# Patient Record
Sex: Female | Born: 1945 | Race: Black or African American | Hispanic: No | Smoking: Never smoker
Health system: Southern US, Community
[De-identification: ages and names within clinical notes are randomized; demographics above are authoritative.]

## PROBLEM LIST (undated history)

## (undated) DIAGNOSIS — I10 Essential (primary) hypertension: Secondary | ICD-10-CM

## (undated) DIAGNOSIS — F329 Major depressive disorder, single episode, unspecified: Secondary | ICD-10-CM

## (undated) DIAGNOSIS — I639 Cerebral infarction, unspecified: Secondary | ICD-10-CM

## (undated) DIAGNOSIS — E785 Hyperlipidemia, unspecified: Secondary | ICD-10-CM

## (undated) DIAGNOSIS — T7840XA Allergy, unspecified, initial encounter: Secondary | ICD-10-CM

## (undated) DIAGNOSIS — F32A Depression, unspecified: Secondary | ICD-10-CM

## (undated) HISTORY — DX: Essential (primary) hypertension: I10

## (undated) HISTORY — PX: CATARACT EXTRACTION: SUR2

## (undated) HISTORY — DX: Cerebral infarction, unspecified: I63.9

## (undated) HISTORY — DX: Allergy, unspecified, initial encounter: T78.40XA

## (undated) HISTORY — DX: Hyperlipidemia, unspecified: E78.5

## (undated) HISTORY — PX: TUBAL LIGATION: SHX77

---

## 2000-11-26 ENCOUNTER — Inpatient Hospital Stay (HOSPITAL_COMMUNITY): Admission: EM | Admit: 2000-11-26 | Discharge: 2000-11-29 | Payer: Self-pay | Admitting: Internal Medicine

## 2000-11-26 ENCOUNTER — Encounter: Payer: Self-pay | Admitting: Orthopaedic Surgery

## 2000-11-26 ENCOUNTER — Encounter: Payer: Self-pay | Admitting: Internal Medicine

## 2000-11-27 ENCOUNTER — Encounter: Payer: Self-pay | Admitting: Orthopaedic Surgery

## 2000-12-08 ENCOUNTER — Encounter: Payer: Self-pay | Admitting: Family Medicine

## 2000-12-08 ENCOUNTER — Encounter: Payer: Self-pay | Admitting: *Deleted

## 2000-12-08 ENCOUNTER — Inpatient Hospital Stay (HOSPITAL_COMMUNITY): Admission: EM | Admit: 2000-12-08 | Discharge: 2000-12-11 | Payer: Self-pay | Admitting: *Deleted

## 2001-05-25 ENCOUNTER — Encounter: Payer: Self-pay | Admitting: Family Medicine

## 2001-05-25 ENCOUNTER — Ambulatory Visit (HOSPITAL_COMMUNITY): Admission: RE | Admit: 2001-05-25 | Discharge: 2001-05-25 | Payer: Self-pay | Admitting: Family Medicine

## 2001-05-27 ENCOUNTER — Encounter: Payer: Self-pay | Admitting: Obstetrics and Gynecology

## 2001-05-27 ENCOUNTER — Ambulatory Visit (HOSPITAL_COMMUNITY): Admission: RE | Admit: 2001-05-27 | Discharge: 2001-05-27 | Payer: Self-pay | Admitting: Obstetrics and Gynecology

## 2001-06-29 ENCOUNTER — Other Ambulatory Visit: Admission: RE | Admit: 2001-06-29 | Discharge: 2001-06-29 | Payer: Self-pay | Admitting: Family Medicine

## 2002-06-02 ENCOUNTER — Ambulatory Visit (HOSPITAL_COMMUNITY): Admission: RE | Admit: 2002-06-02 | Discharge: 2002-06-02 | Payer: Self-pay | Admitting: Family Medicine

## 2002-06-02 ENCOUNTER — Encounter: Payer: Self-pay | Admitting: Family Medicine

## 2002-06-06 HISTORY — PX: COLONOSCOPY: SHX174

## 2002-06-09 ENCOUNTER — Ambulatory Visit (HOSPITAL_COMMUNITY): Admission: RE | Admit: 2002-06-09 | Discharge: 2002-06-09 | Payer: Self-pay | Admitting: General Surgery

## 2002-06-25 ENCOUNTER — Ambulatory Visit (HOSPITAL_COMMUNITY): Admission: RE | Admit: 2002-06-25 | Discharge: 2002-06-25 | Payer: Self-pay | Admitting: Cardiology

## 2002-07-02 ENCOUNTER — Encounter: Payer: Self-pay | Admitting: Cardiology

## 2002-07-02 ENCOUNTER — Encounter (HOSPITAL_COMMUNITY): Admission: RE | Admit: 2002-07-02 | Discharge: 2002-08-01 | Payer: Self-pay | Admitting: Cardiology

## 2003-06-13 ENCOUNTER — Ambulatory Visit (HOSPITAL_COMMUNITY): Admission: RE | Admit: 2003-06-13 | Discharge: 2003-06-13 | Payer: Self-pay | Admitting: Family Medicine

## 2004-02-23 ENCOUNTER — Ambulatory Visit (HOSPITAL_COMMUNITY): Admission: RE | Admit: 2004-02-23 | Discharge: 2004-02-23 | Payer: Self-pay | Admitting: Family Medicine

## 2004-02-23 ENCOUNTER — Ambulatory Visit (HOSPITAL_COMMUNITY): Admission: RE | Admit: 2004-02-23 | Discharge: 2004-02-23 | Payer: Self-pay | Admitting: *Deleted

## 2004-04-26 ENCOUNTER — Ambulatory Visit (HOSPITAL_COMMUNITY): Admission: RE | Admit: 2004-04-26 | Discharge: 2004-04-26 | Payer: Self-pay | Admitting: Family Medicine

## 2004-06-18 ENCOUNTER — Ambulatory Visit: Payer: Self-pay | Admitting: Family Medicine

## 2004-06-20 ENCOUNTER — Ambulatory Visit (HOSPITAL_COMMUNITY): Admission: RE | Admit: 2004-06-20 | Discharge: 2004-06-20 | Payer: Self-pay | Admitting: Family Medicine

## 2004-08-23 ENCOUNTER — Ambulatory Visit: Payer: Self-pay | Admitting: Family Medicine

## 2004-09-14 ENCOUNTER — Ambulatory Visit: Payer: Self-pay | Admitting: Family Medicine

## 2004-10-18 ENCOUNTER — Ambulatory Visit: Payer: Self-pay | Admitting: Family Medicine

## 2004-12-21 ENCOUNTER — Ambulatory Visit: Payer: Self-pay | Admitting: Family Medicine

## 2005-04-10 ENCOUNTER — Ambulatory Visit: Payer: Self-pay | Admitting: Family Medicine

## 2005-07-22 ENCOUNTER — Ambulatory Visit (HOSPITAL_COMMUNITY): Admission: RE | Admit: 2005-07-22 | Discharge: 2005-07-22 | Payer: Self-pay | Admitting: Family Medicine

## 2005-08-27 ENCOUNTER — Ambulatory Visit: Payer: Self-pay | Admitting: Family Medicine

## 2005-12-31 ENCOUNTER — Ambulatory Visit: Payer: Self-pay | Admitting: Family Medicine

## 2006-01-08 ENCOUNTER — Observation Stay (HOSPITAL_COMMUNITY): Admission: AD | Admit: 2006-01-08 | Discharge: 2006-01-10 | Payer: Self-pay | Admitting: Family Medicine

## 2006-01-21 ENCOUNTER — Ambulatory Visit: Payer: Self-pay | Admitting: Family Medicine

## 2006-03-04 ENCOUNTER — Ambulatory Visit: Payer: Self-pay | Admitting: Family Medicine

## 2006-06-12 ENCOUNTER — Ambulatory Visit: Payer: Self-pay | Admitting: Family Medicine

## 2006-07-10 ENCOUNTER — Ambulatory Visit: Payer: Self-pay | Admitting: Family Medicine

## 2006-08-27 ENCOUNTER — Other Ambulatory Visit: Admission: RE | Admit: 2006-08-27 | Discharge: 2006-08-27 | Payer: Self-pay | Admitting: Family Medicine

## 2006-08-27 ENCOUNTER — Ambulatory Visit: Payer: Self-pay | Admitting: Family Medicine

## 2006-08-27 LAB — CONVERTED CEMR LAB: Pap Smear: NORMAL

## 2006-09-04 ENCOUNTER — Ambulatory Visit (HOSPITAL_COMMUNITY): Admission: RE | Admit: 2006-09-04 | Discharge: 2006-09-04 | Payer: Self-pay | Admitting: Family Medicine

## 2006-12-01 ENCOUNTER — Ambulatory Visit: Payer: Self-pay | Admitting: Family Medicine

## 2006-12-01 LAB — CONVERTED CEMR LAB
Albumin: 4.4 g/dL (ref 3.5–5.2)
Alkaline Phosphatase: 54 units/L (ref 39–117)
Indirect Bilirubin: 0.5 mg/dL (ref 0.0–0.9)
LDL Cholesterol: 124 mg/dL — ABNORMAL HIGH (ref 0–99)
Total Bilirubin: 0.6 mg/dL (ref 0.3–1.2)
Total CHOL/HDL Ratio: 3.8

## 2007-01-26 ENCOUNTER — Ambulatory Visit (HOSPITAL_COMMUNITY): Admission: RE | Admit: 2007-01-26 | Discharge: 2007-01-26 | Payer: Self-pay | Admitting: Family Medicine

## 2007-01-26 ENCOUNTER — Ambulatory Visit: Payer: Self-pay | Admitting: Family Medicine

## 2007-01-26 LAB — CONVERTED CEMR LAB
ALT: 30 units/L (ref 0–35)
Albumin: 4.8 g/dL (ref 3.5–5.2)
Alkaline Phosphatase: 61 units/L (ref 39–117)
BUN: 29 mg/dL — ABNORMAL HIGH (ref 6–23)
Creatinine, Ser: 1.52 mg/dL — ABNORMAL HIGH (ref 0.40–1.20)
Hemoglobin: 10.2 g/dL — ABNORMAL LOW (ref 12.0–15.0)
Lymphocytes Relative: 27 % (ref 12–46)
MCHC: 31.5 g/dL (ref 30.0–36.0)
MCV: 91 fL (ref 78.0–100.0)
Monocytes Relative: 11 % (ref 3–11)
Neutrophils Relative %: 60 % (ref 43–77)
Platelets: 247 10*3/uL (ref 150–400)
Potassium: 4.5 meq/L (ref 3.5–5.3)
RBC: 3.56 M/uL — ABNORMAL LOW (ref 3.87–5.11)
RDW: 16.8 % — ABNORMAL HIGH (ref 11.5–14.0)
Total Bilirubin: 0.7 mg/dL (ref 0.3–1.2)
Total Protein: 8.4 g/dL — ABNORMAL HIGH (ref 6.0–8.3)
WBC: 6.2 10*3/uL (ref 4.0–10.5)

## 2007-01-27 ENCOUNTER — Encounter: Payer: Self-pay | Admitting: Family Medicine

## 2007-01-27 LAB — CONVERTED CEMR LAB
Ferritin: 1066 ng/mL — ABNORMAL HIGH (ref 10–291)
Folate: 6.6 ng/mL
Iron: 40 ug/dL — ABNORMAL LOW (ref 42–145)
Retic Ct Pct: 1.5 % (ref 0.4–3.1)
TIBC: 308 ug/dL (ref 250–470)
UIBC: 268 ug/dL
Vitamin B-12: 460 pg/mL (ref 211–911)

## 2007-02-17 ENCOUNTER — Ambulatory Visit: Payer: Self-pay | Admitting: Gastroenterology

## 2007-02-19 ENCOUNTER — Ambulatory Visit: Payer: Self-pay | Admitting: Gastroenterology

## 2007-02-19 ENCOUNTER — Ambulatory Visit (HOSPITAL_COMMUNITY): Admission: RE | Admit: 2007-02-19 | Discharge: 2007-02-19 | Payer: Self-pay | Admitting: Gastroenterology

## 2007-02-19 HISTORY — PX: COLONOSCOPY: SHX174

## 2007-02-19 HISTORY — PX: ESOPHAGOGASTRODUODENOSCOPY: SHX1529

## 2007-03-09 ENCOUNTER — Ambulatory Visit: Payer: Self-pay | Admitting: Family Medicine

## 2007-04-17 ENCOUNTER — Ambulatory Visit: Payer: Self-pay | Admitting: Gastroenterology

## 2007-04-17 ENCOUNTER — Emergency Department (HOSPITAL_COMMUNITY): Admission: EM | Admit: 2007-04-17 | Discharge: 2007-04-17 | Payer: Self-pay | Admitting: Emergency Medicine

## 2007-05-01 ENCOUNTER — Emergency Department (HOSPITAL_COMMUNITY): Admission: EM | Admit: 2007-05-01 | Discharge: 2007-05-01 | Payer: Self-pay | Admitting: Emergency Medicine

## 2007-05-21 ENCOUNTER — Ambulatory Visit: Payer: Self-pay | Admitting: Family Medicine

## 2007-06-29 ENCOUNTER — Ambulatory Visit: Payer: Self-pay | Admitting: Family Medicine

## 2007-06-29 LAB — CONVERTED CEMR LAB
ALT: 22 units/L (ref 0–35)
AST: 28 units/L (ref 0–37)
Albumin: 4.5 g/dL (ref 3.5–5.2)
Calcium: 10 mg/dL (ref 8.4–10.5)
Indirect Bilirubin: 0.4 mg/dL (ref 0.0–0.9)
Sodium: 137 meq/L (ref 135–145)
Total CHOL/HDL Ratio: 3.9
Total Protein: 8.2 g/dL (ref 6.0–8.3)

## 2007-07-13 ENCOUNTER — Ambulatory Visit: Payer: Self-pay | Admitting: Family Medicine

## 2007-07-24 ENCOUNTER — Ambulatory Visit: Payer: Self-pay | Admitting: Cardiology

## 2007-08-26 ENCOUNTER — Ambulatory Visit: Payer: Self-pay | Admitting: Family Medicine

## 2007-10-06 ENCOUNTER — Ambulatory Visit: Payer: Self-pay | Admitting: Family Medicine

## 2007-10-27 ENCOUNTER — Ambulatory Visit (HOSPITAL_COMMUNITY): Admission: RE | Admit: 2007-10-27 | Discharge: 2007-10-27 | Payer: Self-pay | Admitting: Unknown Physician Specialty

## 2008-02-02 ENCOUNTER — Ambulatory Visit: Payer: Self-pay | Admitting: Family Medicine

## 2008-02-02 LAB — CONVERTED CEMR LAB
ALT: 23 units/L (ref 0–35)
Alkaline Phosphatase: 44 units/L (ref 39–117)
BUN: 17 mg/dL (ref 6–23)
Basophils Relative: 1 % (ref 0–1)
CO2: 16 meq/L — ABNORMAL LOW (ref 19–32)
Calcium: 10.1 mg/dL (ref 8.4–10.5)
Chloride: 104 meq/L (ref 96–112)
Eosinophils Absolute: 0 10*3/uL (ref 0.0–0.7)
Eosinophils Relative: 0 % (ref 0–5)
HCT: 29.7 % — ABNORMAL LOW (ref 36.0–46.0)
Hemoglobin: 9.6 g/dL — ABNORMAL LOW (ref 12.0–15.0)
MCHC: 32.3 g/dL (ref 30.0–36.0)
Monocytes Relative: 7 % (ref 3–12)
Neutrophils Relative %: 66 % (ref 43–77)
Platelets: 331 10*3/uL (ref 150–400)
RBC: 3.36 M/uL — ABNORMAL LOW (ref 3.87–5.11)
Sodium: 144 meq/L (ref 135–145)
Total Protein: 8.1 g/dL (ref 6.0–8.3)
WBC: 7.3 10*3/uL (ref 4.0–10.5)

## 2008-02-05 ENCOUNTER — Encounter: Payer: Self-pay | Admitting: Family Medicine

## 2008-02-05 DIAGNOSIS — I10 Essential (primary) hypertension: Secondary | ICD-10-CM | POA: Insufficient documentation

## 2008-02-05 DIAGNOSIS — E785 Hyperlipidemia, unspecified: Secondary | ICD-10-CM

## 2008-07-14 ENCOUNTER — Ambulatory Visit: Payer: Self-pay | Admitting: Family Medicine

## 2008-07-14 DIAGNOSIS — B350 Tinea barbae and tinea capitis: Secondary | ICD-10-CM

## 2008-10-13 ENCOUNTER — Encounter: Payer: Self-pay | Admitting: Family Medicine

## 2008-10-13 ENCOUNTER — Other Ambulatory Visit: Admission: RE | Admit: 2008-10-13 | Discharge: 2008-10-13 | Payer: Self-pay | Admitting: Family Medicine

## 2008-10-13 ENCOUNTER — Ambulatory Visit: Payer: Self-pay | Admitting: Family Medicine

## 2008-10-13 LAB — CONVERTED CEMR LAB
ALT: 29 units/L (ref 0–35)
AST: 46 units/L — ABNORMAL HIGH (ref 0–37)
Albumin: 4.8 g/dL (ref 3.5–5.2)
BUN: 14 mg/dL (ref 6–23)
Basophils Absolute: 0 10*3/uL (ref 0.0–0.1)
CO2: 19 meq/L (ref 19–32)
Creatinine, Ser: 1.22 mg/dL — ABNORMAL HIGH (ref 0.40–1.20)
HCT: 31.2 % — ABNORMAL LOW (ref 36.0–46.0)
Indirect Bilirubin: 0.6 mg/dL (ref 0.0–0.9)
MCV: 88.6 fL (ref 78.0–100.0)
Monocytes Absolute: 0.7 10*3/uL (ref 0.1–1.0)
Neutro Abs: 2.5 10*3/uL (ref 1.7–7.7)
Neutrophils Relative %: 50 % (ref 43–77)
OCCULT 1: NEGATIVE
RBC: 3.52 M/uL — ABNORMAL LOW (ref 3.87–5.11)
RDW: 16.6 % — ABNORMAL HIGH (ref 11.5–15.5)
TSH: 3.268 microintl units/mL (ref 0.350–4.50)
Total Bilirubin: 0.8 mg/dL (ref 0.3–1.2)
Total CHOL/HDL Ratio: 2.3
Total Protein: 8.3 g/dL (ref 6.0–8.3)
WBC: 5.1 10*3/uL (ref 4.0–10.5)

## 2008-12-15 ENCOUNTER — Encounter: Payer: Self-pay | Admitting: Family Medicine

## 2009-01-13 ENCOUNTER — Ambulatory Visit (HOSPITAL_COMMUNITY): Admission: RE | Admit: 2009-01-13 | Discharge: 2009-01-13 | Payer: Self-pay | Admitting: Family Medicine

## 2009-02-01 ENCOUNTER — Encounter: Payer: Self-pay | Admitting: Family Medicine

## 2009-02-15 ENCOUNTER — Ambulatory Visit: Payer: Self-pay | Admitting: Family Medicine

## 2009-02-21 ENCOUNTER — Encounter: Payer: Self-pay | Admitting: Family Medicine

## 2009-03-21 ENCOUNTER — Encounter: Payer: Self-pay | Admitting: Family Medicine

## 2009-04-10 ENCOUNTER — Encounter: Payer: Self-pay | Admitting: Family Medicine

## 2009-04-10 LAB — CONVERTED CEMR LAB
ALT: 16 units/L (ref 0–35)
AST: 28 units/L (ref 0–37)
Alkaline Phosphatase: 57 units/L (ref 39–117)
BUN: 15 mg/dL (ref 6–23)
CO2: 18 meq/L — ABNORMAL LOW (ref 19–32)
Calcium: 9.9 mg/dL (ref 8.4–10.5)
Chloride: 102 meq/L (ref 96–112)
Cholesterol: 217 mg/dL — ABNORMAL HIGH (ref 0–200)
Creatinine, Ser: 1.1 mg/dL (ref 0.40–1.20)
Sodium: 139 meq/L (ref 135–145)
Total Bilirubin: 1 mg/dL (ref 0.3–1.2)
VLDL: 12 mg/dL (ref 0–40)

## 2009-04-20 ENCOUNTER — Ambulatory Visit: Payer: Self-pay | Admitting: Family Medicine

## 2009-04-21 ENCOUNTER — Encounter: Payer: Self-pay | Admitting: Family Medicine

## 2009-05-05 ENCOUNTER — Encounter: Payer: Self-pay | Admitting: Family Medicine

## 2009-05-08 LAB — CONVERTED CEMR LAB
Albumin: 4.4 g/dL (ref 3.5–5.2)
Alkaline Phosphatase: 46 units/L (ref 39–117)
CO2: 22 meq/L (ref 19–32)
Calcium: 10 mg/dL (ref 8.4–10.5)
Cholesterol: 210 mg/dL — ABNORMAL HIGH (ref 0–200)
Creatinine, Ser: 1.38 mg/dL — ABNORMAL HIGH (ref 0.40–1.20)
Glucose, Bld: 105 mg/dL — ABNORMAL HIGH (ref 70–99)
Indirect Bilirubin: 0.4 mg/dL (ref 0.0–0.9)
LDL Cholesterol: 135 mg/dL — ABNORMAL HIGH (ref 0–99)
Total Bilirubin: 0.5 mg/dL (ref 0.3–1.2)
Triglycerides: 55 mg/dL (ref <150)
VLDL: 11 mg/dL (ref 0–40)

## 2009-07-10 ENCOUNTER — Encounter: Payer: Self-pay | Admitting: Family Medicine

## 2009-08-24 ENCOUNTER — Ambulatory Visit: Payer: Self-pay | Admitting: Family Medicine

## 2009-10-09 ENCOUNTER — Telehealth: Payer: Self-pay | Admitting: Physician Assistant

## 2009-11-10 ENCOUNTER — Encounter: Payer: Self-pay | Admitting: Family Medicine

## 2009-12-05 ENCOUNTER — Ambulatory Visit: Payer: Self-pay | Admitting: Family Medicine

## 2009-12-05 DIAGNOSIS — R7301 Impaired fasting glucose: Secondary | ICD-10-CM | POA: Insufficient documentation

## 2009-12-05 DIAGNOSIS — R5383 Other fatigue: Secondary | ICD-10-CM

## 2009-12-05 DIAGNOSIS — R5381 Other malaise: Secondary | ICD-10-CM | POA: Insufficient documentation

## 2009-12-08 LAB — CONVERTED CEMR LAB
AST: 23 units/L (ref 0–37)
Albumin: 4.5 g/dL (ref 3.5–5.2)
Alkaline Phosphatase: 49 units/L (ref 39–117)
BUN: 17 mg/dL (ref 6–23)
CO2: 23 meq/L (ref 19–32)
Cholesterol: 241 mg/dL — ABNORMAL HIGH (ref 0–200)
Creatinine, Ser: 1.46 mg/dL — ABNORMAL HIGH (ref 0.40–1.20)
Glucose, Bld: 108 mg/dL — ABNORMAL HIGH (ref 70–99)
Hgb A1c MFr Bld: 5.3 % (ref <5.7)
LDL Cholesterol: 158 mg/dL — ABNORMAL HIGH (ref 0–99)
Potassium: 5 meq/L (ref 3.5–5.3)
Sodium: 139 meq/L (ref 135–145)
Total Protein: 8.2 g/dL (ref 6.0–8.3)
Triglycerides: 55 mg/dL (ref <150)
VLDL: 11 mg/dL (ref 0–40)

## 2009-12-12 ENCOUNTER — Encounter: Payer: Self-pay | Admitting: Family Medicine

## 2009-12-27 ENCOUNTER — Encounter: Payer: Self-pay | Admitting: Family Medicine

## 2010-01-05 ENCOUNTER — Ambulatory Visit: Payer: Self-pay | Admitting: Family Medicine

## 2010-01-05 DIAGNOSIS — E860 Dehydration: Secondary | ICD-10-CM | POA: Insufficient documentation

## 2010-01-05 DIAGNOSIS — H538 Other visual disturbances: Secondary | ICD-10-CM | POA: Insufficient documentation

## 2010-01-05 DIAGNOSIS — K5289 Other specified noninfective gastroenteritis and colitis: Secondary | ICD-10-CM

## 2010-01-05 LAB — CONVERTED CEMR LAB
Blood in Urine, dipstick: NEGATIVE
Specific Gravity, Urine: >=1.03
Urobilinogen, UA: 0.2
pH: 5

## 2010-01-16 ENCOUNTER — Ambulatory Visit (HOSPITAL_COMMUNITY): Admission: RE | Admit: 2010-01-16 | Discharge: 2010-01-16 | Payer: Self-pay | Admitting: Family Medicine

## 2010-01-26 ENCOUNTER — Encounter: Payer: Self-pay | Admitting: Family Medicine

## 2010-04-10 ENCOUNTER — Ambulatory Visit: Payer: Self-pay | Admitting: Family Medicine

## 2010-04-10 DIAGNOSIS — M79609 Pain in unspecified limb: Secondary | ICD-10-CM | POA: Insufficient documentation

## 2010-04-10 DIAGNOSIS — R0989 Other specified symptoms and signs involving the circulatory and respiratory systems: Secondary | ICD-10-CM

## 2010-04-11 LAB — CONVERTED CEMR LAB
Alkaline Phosphatase: 44 units/L (ref 39–117)
Bilirubin, Direct: 0.1 mg/dL (ref 0.0–0.3)
CO2: 22 meq/L (ref 19–32)
Calcium: 9.6 mg/dL (ref 8.4–10.5)
Cholesterol: 172 mg/dL (ref 0–200)
HDL: 63 mg/dL (ref >39)
Indirect Bilirubin: 0.7 mg/dL (ref 0.0–0.9)
LDL Cholesterol: 96 mg/dL (ref 0–99)
Total Bilirubin: 0.8 mg/dL (ref 0.3–1.2)
VLDL: 13 mg/dL (ref 0–40)

## 2010-04-12 ENCOUNTER — Ambulatory Visit (HOSPITAL_COMMUNITY): Admission: RE | Admit: 2010-04-12 | Discharge: 2010-04-12 | Payer: Self-pay | Admitting: Family Medicine

## 2010-05-02 ENCOUNTER — Encounter: Payer: Self-pay | Admitting: Family Medicine

## 2010-06-05 ENCOUNTER — Ambulatory Visit (HOSPITAL_COMMUNITY): Admission: RE | Admit: 2010-06-05 | Discharge: 2010-06-05 | Payer: Self-pay | Admitting: Ophthalmology

## 2010-06-21 ENCOUNTER — Encounter: Payer: Self-pay | Admitting: Family Medicine

## 2010-07-19 ENCOUNTER — Ambulatory Visit: Payer: Self-pay | Admitting: Family Medicine

## 2010-07-19 LAB — CONVERTED CEMR LAB
ALT: 10 units/L (ref 0–35)
Albumin: 4.4 g/dL (ref 3.5–5.2)
Alkaline Phosphatase: 40 units/L (ref 39–117)
BUN: 18 mg/dL (ref 6–23)
Calcium: 10.8 mg/dL — ABNORMAL HIGH (ref 8.4–10.5)
Cholesterol: 175 mg/dL (ref 0–200)
Creatinine, Ser: 1.41 mg/dL — ABNORMAL HIGH (ref 0.40–1.20)
Glucose, Bld: 97 mg/dL (ref 70–99)
Potassium: 3.9 meq/L (ref 3.5–5.3)
Total Bilirubin: 0.9 mg/dL (ref 0.3–1.2)
Total CHOL/HDL Ratio: 2.7
Triglycerides: 62 mg/dL (ref <150)
Vit D, 25-Hydroxy: 52 ng/mL (ref 30–89)

## 2010-07-29 DIAGNOSIS — J309 Allergic rhinitis, unspecified: Secondary | ICD-10-CM | POA: Insufficient documentation

## 2010-08-12 DIAGNOSIS — I639 Cerebral infarction, unspecified: Secondary | ICD-10-CM

## 2010-08-12 HISTORY — PX: EYE SURGERY: SHX253

## 2010-08-12 HISTORY — DX: Cerebral infarction, unspecified: I63.9

## 2010-09-02 ENCOUNTER — Encounter: Payer: Self-pay | Admitting: Family Medicine

## 2010-09-11 NOTE — Medication Information (Signed)
Summary: Tax adviser   Imported By: Lind Guest 07/19/2010 16:45:44  _____________________________________________________________________  External Attachment:    Type:   Image     Comment:   External Document

## 2010-09-11 NOTE — Assessment & Plan Note (Signed)
Summary: office visit   Vital Signs:  Patient profile:   65 year old female Menstrual status:  postmenopausal Height:      64 inches Weight:      146 pounds BMI:     25.15 O2 Sat:      99 % on Room air Pulse rate:   94 / minute Pulse rhythm:   regular Resp:     16 per minute BP sitting:   100 / 70  (left arm)  Vitals Entered By: Worthy Keeler LPN (August 24, 2009 10:33 AM)  Nutrition Counseling: Patient's BMI is greater than 25 and therefore counseled on weight management options.  O2 Flow:  Room air CC: follow-up visit Is Patient Diabetic? No Pain Assessment Patient in pain? no        Primary Care Provider:  Syliva Overman MD  CC:  follow-up visit.  History of Present Illness: Reports  that she has been doing well. Denies recent fever or chills. Denies sinus pressure, nasal congestion , ear pain or sore throat. Denies chest congestion, or cough productive of sputum. Denies chest pain, palpitations, PND, orthopnea or leg swelling. Denies abdominal pain, nausea, vomitting, diarrhea or constipation. Denies change in bowel movements or bloody stool. Denies dysuria , frequency, incontinence or hesitancy. Denies  joint pain, swelling, or reduced mobility. Denies headaches, vertigo, seizures. Denies depression, anxiety or insomnia. Denies  rash, lesions, or itch.    .h Current Medications (verified): 1)  Lisinopril-Hydrochlorothiazide 20-12.5 Mg  Tabs (Lisinopril-Hydrochlorothiazide) .... Take Two Tablets By Mouth Once Daily 2)  Lipitor 80 Mg  Tabs (Atorvastatin Calcium) .... Take 1 Tab By Mouth At Bedtime 3)  Azor 10-40 Mg  Tabs (Amlodipine-Olmesartan) .... Take 1 Tablet By Mouth Once A Day 4)  Trilipix 135 Mg  Cpdr (Choline Fenofibrate) .... Take 1 Tab By Mouth At Bedtime 5)  Clonidine Hcl 0.2 Mg Tabs (Clonidine Hcl) .... Take 1 Tablet By Mouth Two Times A Day  Allergies (verified): 1)  ! Diovan  Review of Systems      See HPI Eyes:  Denies discharge and  red eye. Neuro:  Denies falling down, headaches, seizures, and sensation of room spinning. Heme:  Denies abnormal bruising and bleeding. Allergy:  Denies hives or rash and itching eyes.  Physical Exam  General:  Well-developed,well-nourished,in no acute distress; alert,appropriate and cooperative throughout examination HEENT: No facial asymmetry,  EOMI, No sinus tenderness, TM's Clear, oropharynx  pink and moist.   Chest: Clear to auscultation bilaterally.  CVS: S1, S2, No murmurs, No S3.   Abd: Soft, Nontender.  MS: Adequate ROM spine, hips, shoulders and knees.  Ext: No edema.   CNS: CN 2-12 intact, power tone and sensation normal throughout.   Skin: Intact, no visible lesions or rashes.  Psych: Good eye contact, normal affect.  Memory intact, anxious  appearing.    Impression & Recommendations:  Problem # 1:  HYPERLIPIDEMIA (ICD-272.4) Assessment Comment Only  Her updated medication list for this problem includes:    Lipitor 80 Mg Tabs (Atorvastatin calcium) .Marland Kitchen... Take 1 tab by mouth at bedtime    Trilipix 135 Mg Cpdr (Choline fenofibrate) .Marland Kitchen... Take 1 tab by mouth at bedtime  Orders: T-Lipid Profile [phone]) T-Hepatic Function [phone])  Labs Reviewed: SGOT: 25 (05/05/2009)   SGPT: 13 (05/05/2009)   HDL:64 (05/05/2009), 87 (04/10/2009)  LDL:135 (05/05/2009), 118 (04/10/2009)  Chol:210 (05/05/2009), 217 (04/10/2009)  Trig:55 (05/05/2009), 58 (04/10/2009)  Problem # 2:  HYPERTENSION (ICD-401.9) Assessment: Comment Only  The following medications  were removed from the medication list:    Clonidine Hcl 0.2 Mg Tabs (Clonidine hcl) .Marland Kitchen... Take 1 tablet by mouth two times a day Her updated medication list for this problem includes:    Lisinopril-hydrochlorothiazide 20-12.5 Mg Tabs (Lisinopril-hydrochlorothiazide) .Marland Kitchen... Take two tablets by mouth once daily    Azor 10-40 Mg Tabs (Amlodipine-olmesartan) .Marland Kitchen... Take 1 tablet by mouth once a day    Clonidine Hcl 0.2 Mg  Tabs (Clonidine hcl) .Marland Kitchen... Take 1 tab by mouth at bedtime  Orders: T-Basic Metabolic Panel [id number])  BP today: 100/70, overcorrected, reduce clonidine to 1 at bedtime Prior BP: 128/80 (04/20/2009)  Labs Reviewed: K+: 3.8 (05/05/2009) Creat: : 1.38 (05/05/2009)   Chol: 210 (05/05/2009)   HDL: 64 (05/05/2009)   LDL: 135 (05/05/2009)   TG: 55 (05/05/2009)  Complete Medication List: 1)  Lisinopril-hydrochlorothiazide 20-12.5 Mg Tabs (Lisinopril-hydrochlorothiazide) .... Take two tablets by mouth once daily 2)  Lipitor 80 Mg Tabs (Atorvastatin calcium) .... Take 1 tab by mouth at bedtime 3)  Azor 10-40 Mg Tabs (Amlodipine-olmesartan) .... Take 1 tablet by mouth once a day 4)  Trilipix 135 Mg Cpdr (Choline fenofibrate) .... Take 1 tab by mouth at bedtime 5)  Clonidine Hcl 0.2 Mg Tabs (Clonidine hcl) .... Take 1 tab by mouth at bedtime 6)  Aspir-low 81 Mg Tbec (Aspirin) .... Take 1 tablet by mouth once a day 7)  Oscal 500/200 D-3 500-200 Mg-unit Tabs (Calcium-vitamin d) .... Take 1 tablet by mouth three times a day  Other Orders: T-CBC w/Diff ([id number]) T-Anemia Panel 3  (2904) Radiology Referral (Radiology)  Patient Instructions: 1)  Please schedule a follow-up appointment in 3 months. 2)  YOUR BP is too low, cut down on clonidine to 0.2 mg one at bedtime, no other med changes. 3)  BMP prior to visit, ICD-9: 4)  Hepatic Panel prior to visit, ICD-9: 5)  Lipid Panel prior to visit, ICD-9: 6)  CBC w/ Diff prior to visit, ICD-9:  and anemia panel today. 7)  You need to start calcium and asprin every day, I will send in. Prescriptions: OSCAL 500/200 D-3 500-200 MG-UNIT TABS (CALCIUM-VITAMIN D) Take 1 tablet by mouth three times a day  #270 x 3   Entered and Authorized by:   Syliva Overman MD   Signed by:   Syliva Overman MD on 08/24/2009   Method used:   Electronically to        [facility]  [address] (retail)       [address]       [place], [place]   [address]       Ph: [phone]       Fax: [phone]   RxID:   [id number] ASPIR-LOW 81 MG TBEC (ASPIRIN) Take 1 tablet by mouth once a day  #90 x 3   Entered and Authorized by:   Syliva Overman MD   Signed by:   Syliva Overman MD on 08/24/2009   Method used:   Electronically to        [facility]  [address] [facility] (retail)       [address]       [place], [place]  [address]       Ph: [phone]       Fax: [phone]   RxID:   [id number] CLONIDINE HCL 0.2 MG TABS (CLONIDINE HCL) Take 1 tab by mouth at bedtime  #90 x  3   Entered and Authorized by:   Syliva Overman MD   Signed by:   Syliva Overman MD on 08/24/2009   Method used:   Printed then faxed to ...         RxID:   [id number]

## 2010-09-11 NOTE — Letter (Signed)
Summary: RX ASSISTANCE  RX ASSISTANCE   Imported By: Lind Guest 06/21/2010 09:04:06  _____________________________________________________________________  External Attachment:    Type:   Image     Comment:   External Document

## 2010-09-11 NOTE — Letter (Signed)
Summary: PATIENT ASSISTANCE RX  PATIENT ASSISTANCE RX   Imported By: Lind Guest 12/27/2009 09:03:57  _____________________________________________________________________  External Attachment:    Type:   Image     Comment:   External Document

## 2010-09-11 NOTE — Letter (Signed)
Summary: rx assistance  rx assistance   Imported By: Lind Guest 05/02/2010 08:24:41  _____________________________________________________________________  External Attachment:    Type:   Image     Comment:   External Document

## 2010-09-11 NOTE — Letter (Signed)
Summary: PATIENT ASSISTANCE RX  PATIENT ASSISTANCE RX   Imported By: Lind Guest 01/26/2010 08:36:11  _____________________________________________________________________  External Attachment:    Type:   Image     Comment:   External Document

## 2010-09-11 NOTE — Assessment & Plan Note (Signed)
Summary: sick- room 1   Vital Signs:  Patient profile:   65 year old female Menstrual status:  postmenopausal Height:      64 inches Weight:      146.75 pounds BMI:     25.28 O2 Sat:      100 % on Room air Pulse rate:   119 / minute Resp:     16 per minute BP supine:   108 / 60  (left arm) BP sitting:   118 / 80  (left arm) BP standing:   110 / 70  (left arm)  Vitals Entered By: Adella Hare LPN (Jan 05, 2010 8:28 AM)  CC: abdomen swollen, diarrhea, left eye problems Is Patient Diabetic? No Pain Assessment Patient in pain? no      Comments did not bring meds to ov   Primary Provider:  Syliva Overman MD  CC:  abdomen swollen, diarrhea, and left eye problems.  History of Present Illness: Pt presents today with c/o abd bloating and nausea x 3 days.  No fever or chills.  Her appetitie has been decreased too.  Her BM's have been nl soft brown stool, until this am & had watery stool x1, no blood.  Pt also reports intermittent blurred vision when she lies on her lt side the last couple of days.  She states her vision goes black, comes back but then is blurry off & on. No HA's.  She has an eye dr appt next mos.  Pt also reports dizziness the last 2-3 days, with change of position. No HA, numbness, tingling, or weakness.   Allergies (verified): 1)  ! Diovan  Past History:  Past medical history reviewed for relevance to current acute and chronic problems.  Past Medical History: Reviewed history from 02/05/2008 and no changes required.   HYPERLIPIDEMIA (ICD-272.4) HYPERTENSION (ICD-401.9)  Review of Systems General:  Denies chills and fever. ENT:  Denies nasal congestion. CV:  Denies chest pain or discomfort and palpitations. Resp:  Denies cough and shortness of breath. GI:  Complains of diarrhea, loss of appetite, and nausea; denies abdominal pain, bloody stools, and vomiting. GU:  Denies dysuria and urinary frequency.  Physical Exam  General:   Well-developed,well-nourished,in no acute distress; alert,appropriate and cooperative throughout examination Head:  Normocephalic and atraumatic without obvious abnormalities. No apparent alopecia or balding. Eyes:  No corneal or conjunctival inflammation noted. EOMI. Perrla. Funduscopic exam benign, without hemorrhages, exudates or papilledema OS.  Attempted fundoscopic exam OD but everything is black and unable to visualize. Ears:  External ear exam shows no significant lesions or deformities.  Otoscopic examination reveals clear canals, tympanic membranes are intact bilaterally without bulging, retraction, inflammation or discharge. Hearing is grossly normal bilaterally. Nose:  External nasal examination shows no deformity or inflammation. Nasal mucosa are pink and moist without lesions or exudates. Mouth:  Oral mucosa and oropharynx without lesions or exudates.  Teeth in good repair. Neck:  No deformities, masses, or tenderness noted. Lungs:  Normal respiratory effort, chest expands symmetrically. Lungs are clear to auscultation, no crackles or wheezes. Heart:  Normal rate and regular rhythm. S1 and S2 normal without gallop, murmur, click, rub or other extra sounds. Abdomen:  Bowel sounds positive,abdomen soft and non-tender without masses, organomegaly or hernias noted. Cervical Nodes:  No lymphadenopathy noted Psych:  Cognition and judgment appear intact. Alert and cooperative with normal attention span and concentration. No apparent delusions, illusions, hallucinations   Impression & Recommendations:  Problem # 1:  GASTROENTERITIS (ICD-558.9) Assessment New  Her updated medication list for this problem includes:    Ondansetron Hcl 4 Mg Tabs (Ondansetron hcl) .Marland Kitchen... Take 1 every 8 hours as needed for nausea  Problem # 2:  DEHYDRATION (ICD-276.51) - Mild Assessment: New Discussed with pt that she needs to increase her fluids. If she worsens over the weekend to go to ER.  Problem # 3:   BLURRED VISION, INTERMITTENT (ICD-368.8) Assessment: New Pt to call her eye Dr today for an appt.  Complete Medication List: 1)  Lisinopril-hydrochlorothiazide 20-12.5 Mg Tabs (Lisinopril-hydrochlorothiazide) .... Take two tablets by mouth once daily 2)  Azor 10-40 Mg Tabs (Amlodipine-olmesartan) .... Take 1 tablet by mouth once a day 3)  Trilipix 135 Mg Cpdr (Choline fenofibrate) .... Take 1 tab by mouth at bedtime 4)  Clonidine Hcl 0.2 Mg Tabs (Clonidine hcl) .... Take 1 tab by mouth at bedtime 5)  Aspir-low 81 Mg Tbec (Aspirin) .... Take 1 tablet by mouth once a day 6)  Oscal 500/200 D-3 500-200 Mg-unit Tabs (Calcium-vitamin d) .... Take 1 tablet by mouth three times a day 7)  Vitamin D (ergocalciferol) 50000 Unit Caps (Ergocalciferol) .... One capsule once weekly 8)  Crestor 40 Mg Tabs (Rosuvastatin calcium) .... One tab by mouth at bedtime 9)  Ondansetron Hcl 4 Mg Tabs (Ondansetron hcl) .... Take 1 every 8 hours as needed for nausea  Other Orders: Urinalysis ([id number])  Patient Instructions: 1)  Keep your next appt with Dr Lodema Hong. 2)  You need to drink more water.  I also recommend Gatorade. 3)  I have prescribed some medication for nausea. 4)  You may use Imodium if you diarrhea worsens. 5)  CALL YOUR EYE DR TODAY FOR AN APPT. Prescriptions: ONDANSETRON HCL 4 MG TABS (ONDANSETRON HCL) take 1 every 8 hours as needed for nausea  #12 x 0   Entered and Authorized by:   Esperanza Sheets PA   Signed by:   Esperanza Sheets PA on 01/05/2010   Method used:   Electronically to        [facility]  [address] (retail)       [address]       [place], [place]  [address]       Ph: [phone]       Fax: [phone]   RxID:   [id number]   Laboratory Results   Urine Tests  Date/Time Received: Jan 05, 2010 8:57 AM  Date/Time Reported: Jan 05, 2010 8:57 AM   Routine Urinalysis   Color: yellow Appearance: Clear Glucose: negative   (Normal Range:  Negative) Bilirubin: small   (Normal Range: Negative) Ketone: trace (5)   (Normal Range: Negative) Spec. Gravity: >=1.030   (Normal Range: 1.003-1.035) Blood: negative   (Normal Range: Negative) pH: 5.0   (Normal Range: 5.0-8.0) Protein: 100   (Normal Range: Negative) Urobilinogen: 0.2   (Normal Range: 0-1) Nitrite: negative   (Normal Range: Negative) Leukocyte Esterace: negative   (Normal Range: Negative)

## 2010-09-11 NOTE — Assessment & Plan Note (Signed)
Summary: office visit   Vital Signs:  Patient profile:   65 year old female Menstrual status:  postmenopausal Height:      64 inches Weight:      147.75 pounds BMI:     25.45 O2 Sat:      100 % on Room air Pulse rate:   64 / minute Pulse rhythm:   regular Resp:     16 per minute BP sitting:   120 / 80  (left arm)  Vitals Entered By: Adella Hare LPN (April 10, 2010 11:02 AM)  Nutrition Counseling: Patient's BMI is greater than 25 and therefore counseled on weight management options.  O2 Flow:  Room air CC: right neck and shoulder pain Is Patient Diabetic? No Pain Assessment Patient in pain? yes     Location: right neck and shoulder Intensity: 7 Type: aching Onset of pain  With activity Comments did not bring meds to pv   Primary Care Provider:  Syliva Overman MD  CC:  right neck and shoulder pain.  History of Present Illness: Pt  expreinced acute left arm numbness, and tingling sensation radiating dopwn left neck, she woke with the symptoms approx 1 week ago, they have lessened , but she is also now experiencing tingling in the rightshoulder.This is the first episode, she denies any chest pain or weakness of the limbs. Reports  that she has otherwise been doing well Denies recent fever or chills. Denies sinus pressure, nasal congestion , ear pain or sore throat. Denies chest congestion, or cough productive of sputum. Denies chest pain, palpitations, PND, orthopnea or leg swelling. Denies abdominal pain, nausea, vomitting, diarrhea or constipation. Denies change in bowel movements or bloody stool. Denies dysuria , frequency, incontinence or hesitancy. Denies  joint pain, swelling, or reduced mobility. Denies headaches, vertigo, seizures. Denies depression, anxiety or insomnia. Denies  rash, lesions, or itch.     Allergies (verified): 1)  ! Diovan  Review of Systems      See HPI General:  Complains of fatigue. Eyes:  Complains of vision loss-both eyes;  has cataract which should be operatedon, but postponing it because of cost. Neuro:  Complains of numbness; numbness in left upper extwhich std acutely last Friday, this has improved, but not back to normal, also had stiffness in the neck also right shoulder tingling. 2 weeks ago excessive sneezing and coughing with righteyebrow pain. Endo:  Denies cold intolerance, excessive thirst, and excessive urination. Heme:  Denies abnormal bruising and bleeding. Allergy:  Complains of seasonal allergies; denies hives or rash and itching eyes.  Physical Exam  General:  Well-developed,well-nourished,in no acute distress; alert,appropriate and cooperative throughout examination HEENT: No facial asymmetry, decresed ROM cervical spine EOMI, No sinus tenderness, TM's Clear, oropharynx  pink and moist. bilateral carotid bruits  Chest: Clear to auscultation bilaterally.  CVS: S1, S2, No murmurs, No S3.   Abd: Soft, Nontender.  MS: Adequate ROM spine, hips, shoulders and knees.  Ext: No edema.   CNS: CN 2-12 intact, power and  tone normal, reduced sensation in  left upper extremity  l   Skin: Intact, no visible lesions or rashes.  Psych: Good eye contact, normal affect.  Memory intact, not anxious or depressed appearing.    Impression & Recommendations:  Problem # 1:  ARM PAIN (ICD-729.5) Assessment Comment Only  Orders: Depo- Medrol 80mg  (J1040) Ketorolac-Toradol 15mg  (N5621) Admin of Therapeutic Inj  intramuscular or subcutaneous (30865)  Problem # 2:  CAROTID BRUITS, BILATERAL (ICD-785.9) Assessment: Comment Only  Orders: Radiology Referral (Radiology)  Problem # 3:  HYPERTENSION (ICD-401.9) Assessment: Unchanged  Her updated medication list for this problem includes:    Lisinopril-hydrochlorothiazide 20-12.5 Mg Tabs (Lisinopril-hydrochlorothiazide) .Marland Kitchen... Take two tablets by mouth once daily    Azor 10-40 Mg Tabs (Amlodipine-olmesartan) .Marland Kitchen... Take 1 tablet by mouth once a day     Clonidine Hcl 0.2 Mg Tabs (Clonidine hcl) .Marland Kitchen... Take 1 tab by mouth at bedtime  Orders: T-Basic Metabolic Panel [id number])  BP today: 120/80 Prior BP: 110/70 (01/05/2010)  Labs Reviewed: K+: 5.0 (12/05/2009) Creat: : 1.46 (12/05/2009)   Chol: 241 (12/05/2009)   HDL: 72 (12/05/2009)   LDL: 158 (12/05/2009)   TG: 55 (12/05/2009)  Problem # 4:  HYPERLIPIDEMIA (ICD-272.4) Assessment: Comment Only  The following medications were removed from the medication list:    Crestor 40 Mg Tabs (Rosuvastatin calcium) ..... One tab by mouth at bedtime Her updated medication list for this problem includes:    Trilipix 135 Mg Cpdr (Choline fenofibrate) .Marland Kitchen... Take 1 tab by mouth at bedtime    Lipitor 80 Mg Tabs (Atorvastatin calcium) .Marland Kitchen... Take 1 tab by mouth at bedtime  Orders: T-Lipid Profile [id number]) T-Hepatic Function [phone])  Labs Reviewed: SGOT: 23 (12/05/2009)   SGPT: 11 (12/05/2009)   HDL:72 (12/05/2009), 61 (08/24/2009)  LDL:158 (12/05/2009), 118 (08/24/2009)  Chol:241 (12/05/2009), 191 (08/24/2009)  Trig:55 (12/05/2009), 58 (08/24/2009)  Complete Medication List: 1)  Lisinopril-hydrochlorothiazide 20-12.5 Mg Tabs (Lisinopril-hydrochlorothiazide) .... Take two tablets by mouth once daily 2)  Azor 10-40 Mg Tabs (Amlodipine-olmesartan) .... Take 1 tablet by mouth once a day 3)  Trilipix 135 Mg Cpdr (Choline fenofibrate) .... Take 1 tab by mouth at bedtime 4)  Clonidine Hcl 0.2 Mg Tabs (Clonidine hcl) .... Take 1 tab by mouth at bedtime 5)  Aspir-low 81 Mg Tbec (Aspirin) .... Take 1 tablet by mouth once a day 6)  Oscal 500/200 D-3 500-200 Mg-unit Tabs (Calcium-vitamin d) .... Take 1 tablet by mouth three times a day 7)  Vitamin D (ergocalciferol) 50000 Unit Caps (Ergocalciferol) .... One capsule once weekly 8)  Ondansetron Hcl 4 Mg Tabs (Ondansetron hcl) .... Take 1 every 8 hours as needed for nausea 9)  Lipitor 80 Mg Tabs (Atorvastatin calcium) .... Take 1 tab by mouth at  bedtime 10)  Prednisone (pak) 5 Mg Tabs (Prednisone) .... Use as directed 11)  Ibuprofen 600 Mg Tabs (Ibuprofen) .... Take 1 tablet by mouth three times a day  Other Orders: T- Hemoglobin A1C ([id number])  Patient Instructions: 1)  Please schedule a follow-up appointment in 3.5 months. 2)  BMP prior to visit, ICD-9: 3)  Hepatic Panel prior to visit, ICD-9: 4)  Lipid Panel prior to visit, ICD-9 5)  hBA1C    fasting today. 6)  injections today for arm pain and numbness,also meds will be sent in 7)  you will be referred for a carotid doppler  8)  pls call if you still have numbness Prescriptions: IBUPROFEN 600 MG TABS (IBUPROFEN) Take 1 tablet by mouth three times a day  #21 x 0   Entered and Authorized by:   Syliva Overman MD   Signed by:   Syliva Overman MD on 04/10/2010   Method used:   Electronically to        [facility]  [address] (retail)       [address]       [place], [place]  [address]  Ph: [phone]       Fax: [phone]   RxID:   [id number] PREDNISONE (PAK) 5 MG TABS (PREDNISONE) Use as directed  #21 x 0   Entered and Authorized by:   Syliva Overman MD   Signed by:   Syliva Overman MD on 04/10/2010   Method used:   Electronically to        [facility]  [address] [facility] (retail)       [address]       [place], [place]  [address]       Ph: [phone]       Fax: [phone]   RxID:   [id number]    Medication Administration  Injection # 1:    Medication: Depo- Medrol 80mg     Diagnosis: ARM PAIN (ICD-729.5)    Route: IM    Site: RUOQ gluteus    Exp Date: 4/12    Lot #: [id number]    Mfr: Pharmacia    Patient tolerated injection without complications    Given by: Adella Hare LPN (April 10, 2010 11:35 AM)  Injection # 2:    Medication: Ketorolac-Toradol 15mg     Diagnosis: ARM PAIN (ICD-729.5)    Route: IM    Site: LUOQ gluteus    Exp Date: 10/11/2011    Lot #: [id number]    Mfr: novaplus    Comments:  toradol 60mg  given    Patient tolerated injection without complications    Given by: Adella Hare LPN (April 10, 2010 11:36 AM)  Orders Added: 1)  Est. Patient Level IV [62376] 2)  T-Basic Metabolic Panel [[id number]] 3)  T-Lipid Profile [80061-22930] 4)  T-Hepatic Function [80076-22960] 5)  T- Hemoglobin A1C [83036-23375] 6)  Radiology Referral [Radiology] 7)  Depo- Medrol 80mg  [J1040] 8)  Ketorolac-Toradol 15mg  [J1885] 9)  Admin of Therapeutic Inj  intramuscular or subcutaneous [28315]

## 2010-09-11 NOTE — Progress Notes (Signed)
Summary: DERMATOLOGY  DERMATOLOGY   Imported By: Lind Guest 11/28/2009 09:05:59  _____________________________________________________________________  External Attachment:    Type:   Image     Comment:   External Document

## 2010-09-11 NOTE — Progress Notes (Signed)
Summary: referral  Phone Note Call from Patient   Summary of Call: pt would like to get a referral for derm. for a spot on scalp. can i refer her to Dr. Margo Aye?  [phone] Initial call taken by: Rudene Anda,  October 09, 2009 1:25 PM  Follow-up for Phone Call        OK to refer to Dr Margo Aye Follow-up by: Esperanza Sheets PA,  October 09, 2009 1:50 PM  Additional Follow-up for Phone Call Additional follow up Details #1::        pt has appt with dr. hall for 10/19/2009 10:30. pt notified  Additional Follow-up by: Rudene Anda,  October 09, 2009 1:56 PM

## 2010-09-11 NOTE — Miscellaneous (Signed)
Summary: med change  Clinical Lists Changes  Medications: Removed medication of LIPITOR 80 MG  TABS (ATORVASTATIN CALCIUM) Take 1 tab by mouth at bedtime Added new medication of CRESTOR 40 MG TABS (ROSUVASTATIN CALCIUM) one tab by mouth at bedtime - Signed Rx of CRESTOR 40 MG TABS (ROSUVASTATIN CALCIUM) one tab by mouth at bedtime;  #90 x 3;  Signed;  Entered by: Adella Hare LPN;  Authorized by: Syliva Overman MD;  Method used: Printed then faxed to [facility] [address] [place], [place], [place]  [address], Ph: [phone], Fax: [phone]    Prescriptions: CRESTOR 40 MG TABS (ROSUVASTATIN CALCIUM) one tab by mouth at bedtime  #90 x 3   Entered by:   Adella Hare LPN   Authorized by:   Syliva Overman MD   Signed by:   Adella Hare LPN on 13/24/4010   Method used:   Printed then faxed to ...       [facility]  [address] [facility] (retail)       [address]       [place], [place]  [address]       Ph: [phone]       Fax: [phone]   RxID:   [id number]

## 2010-09-11 NOTE — Assessment & Plan Note (Signed)
Summary: office visit   Vital Signs:  Patient profile:   65 year old female Menstrual status:  postmenopausal Height:      64 inches Weight:      145.13 pounds BMI:     25.00 O2 Sat:      99 % Pulse rate:   78 / minute Pulse rhythm:   regular Resp:     16 per minute BP sitting:   122 / 80  (left arm) Cuff size:   regular  Vitals Entered By: Everitt Amber LPN (December 05, 2009 10:53 AM) CC: Follow up chronic problems   Primary Care Provider:  Syliva Overman MD  CC:  Follow up chronic problems.  History of Present Illness: Reports  that she has been  doing well. Denies recent fever or chills. Denies sinus pressure, nasal congestion , ear pain or sore throat. Denies chest congestion, or cough productive of sputum. Denies chest pain, palpitations, PND, orthopnea or leg swelling. Denies abdominal pain, nausea, vomitting, diarrhea or constipation. Denies change in bowel movements or bloody stool. Denies dysuria , frequency, incontinence or hesitancy. Denies  joint pain, swelling, or reduced mobility. Denies headaches, vertigo, seizures. Denies depression, anxiety or insomnia. Denies  rash, lesions, or itch.     Allergies (verified): 1)  ! Diovan  Review of Systems      See HPI Eyes:  Denies blurring, discharge, and red eye. Psych:  Denies anxiety and depression. Endo:  Denies cold intolerance, excessive hunger, excessive thirst, excessive urination, heat intolerance, polyuria, and weight change. Heme:  Denies abnormal bruising and bleeding. Allergy:  Complains of seasonal allergies; mild.  Physical Exam  General:  Well-developed,well-nourished,in no acute distress; alert,appropriate and cooperative throughout examination HEENT: No facial asymmetry,  EOMI, No sinus tenderness, TM's Clear, oropharynx  pink and moist.   Chest: Clear to auscultation bilaterally.  CVS: S1, S2, No murmurs, No S3.   Abd: Soft, Nontender.  MS: Adequate ROM spine, hips, shoulders and knees.   Ext: No edema.   CNS: CN 2-12 intact, power tone and sensation normal throughout.   Skin: Intact, no visible lesions or rashes.  Psych: Good eye contact, normal affect.  Memory intact, anxious  appearing.    Impression & Recommendations:  Problem # 1:  IMPAIRED FASTING GLUCOSE (ICD-790.21) Assessment Comment Only  Orders: T- Hemoglobin A1C ([id number])  Labs Reviewed: Creat: 2.12 (08/24/2009)     Problem # 2:  HYPERLIPIDEMIA (ICD-272.4) Assessment: Improved  Her updated medication list for this problem includes:    Lipitor 80 Mg Tabs (Atorvastatin calcium) .Marland Kitchen... Take 1 tab by mouth at bedtime    Trilipix 135 Mg Cpdr (Choline fenofibrate) .Marland Kitchen... Take 1 tab by mouth at bedtime  Orders: T-Hepatic Function [id number]) T-Lipid Profile ([id number])  Labs Reviewed: SGOT: 29 (08/24/2009)   SGPT: 16 (08/24/2009)   HDL:61 (08/24/2009), 64 (05/05/2009)  LDL:118 (08/24/2009), 135 (05/05/2009)  Chol:191 (08/24/2009), 210 (05/05/2009)  Trig:58 (08/24/2009), 55 (05/05/2009)  Problem # 3:  HYPERTENSION (ICD-401.9) Assessment: Unchanged  Her updated medication list for this problem includes:    Lisinopril-hydrochlorothiazide 20-12.5 Mg Tabs (Lisinopril-hydrochlorothiazide) .Marland Kitchen... Take two tablets by mouth once daily    Azor 10-40 Mg Tabs (Amlodipine-olmesartan) .Marland Kitchen... Take 1 tablet by mouth once a day    Clonidine Hcl 0.2 Mg Tabs (Clonidine hcl) .Marland Kitchen... Take 1 tab by mouth at bedtime  Orders: T-Basic Metabolic Panel ([id number])  BP today: 122/80 Prior BP: 100/70 (08/24/2009)  Labs Reviewed: K+: 4.9 (08/24/2009) Creat: : 2.12 (08/24/2009)   Chol: 191 (08/24/2009)  HDL: 61 (08/24/2009)   LDL: 118 (08/24/2009)   TG: 58 (08/24/2009)  Complete Medication List: 1)  Lisinopril-hydrochlorothiazide 20-12.5 Mg Tabs (Lisinopril-hydrochlorothiazide) .... Take two tablets by mouth once daily 2)  Lipitor 80 Mg Tabs (Atorvastatin calcium) .... Take 1 tab by mouth at bedtime 3)  Azor 10-40  Mg Tabs (Amlodipine-olmesartan) .... Take 1 tablet by mouth once a day 4)  Trilipix 135 Mg Cpdr (Choline fenofibrate) .... Take 1 tab by mouth at bedtime 5)  Clonidine Hcl 0.2 Mg Tabs (Clonidine hcl) .... Take 1 tab by mouth at bedtime 6)  Aspir-low 81 Mg Tbec (Aspirin) .... Take 1 tablet by mouth once a day 7)  Oscal 500/200 D-3 500-200 Mg-unit Tabs (Calcium-vitamin d) .... Take 1 tablet by mouth three times a day 8)  Vitamin D (ergocalciferol) 50000 Unit Caps (Ergocalciferol) .... One capsule once weekly  Other Orders: T-TSH [phone]) T-Vitamin D (25-Hydroxy) [phone]) Radiology Referral (Radiology)  Patient Instructions: 1)  Please schedule a follow-up appointment in 4 months. 2)  BMP prior to visit, ICD-9: 3)  Hepatic Panel prior to visit, ICD-9: 4)  Lipid Panel prior to visit, ICD-9:   fasting today 5)  TSH prior to visit, ICD-9: 6)  HbgA1C prior to visit, ICD-9: 7)  vit d 8)  you are doing very well, pls keep it up Prescriptions: VITAMIN D (ERGOCALCIFEROL) 50000 UNIT CAPS (ERGOCALCIFEROL) one capsule once weekly  #4 x 4   Entered and Authorized by:   Syliva Overman MD   Signed by:   Syliva Overman MD on 12/07/2009   Method used:   Electronically to        [facility]  [address] (retail)       [address]       [place], [place]  [address]       Ph: [phone]       Fax: [phone]   RxID:   [id number]

## 2010-09-13 NOTE — Assessment & Plan Note (Signed)
Summary: office visit   Vital Signs:  Patient profile:   65 year old female Menstrual status:  postmenopausal Height:      64 inches Weight:      154.08 pounds BMI:     26.54 O2 Sat:      99 % on Room air Pulse rate:   92 / minute Pulse rhythm:   regular Resp:     16 per minute BP sitting:   140 / 70  (left arm)  Vitals Entered By: Adella Hare LPN (July 19, 2010 11:06 AM)  Nutrition Counseling: Patient's BMI is greater than 25 and therefore counseled on weight management options.  O2 Flow:  Room air CC: follow-up visit Is Patient Diabetic? No Pain Assessment Patient in pain? no        Primary Care Provider:  Syliva Overman MD  CC:  follow-up visit.  History of Present Illness: Reports  that she has been doing fairly well Denies recent fever or chills. Denies sinus pressure, ear pain or sore throat. Denies chest congestion, or cough productive of sputum. Denies chest pain, palpitations, PND, orthopnea or leg swelling. Denies abdominal pain, nausea, vomitting, diarrhea or constipation. Denies change in bowel movements or bloody stool. Denies dysuria , frequency, incontinence or hesitancy.  Denies headaches, vertigo, seizures. Denies depression, anxiety or insomnia. Denies  rash, lesions, or itch.     Current Medications (verified): 1)  Lisinopril-Hydrochlorothiazide 20-12.5 Mg  Tabs (Lisinopril-Hydrochlorothiazide) .... Take Two Tablets By Mouth Once Daily 2)  Azor 10-40 Mg  Tabs (Amlodipine-Olmesartan) .... Take 1 Tablet By Mouth Once A Day 3)  Trilipix 135 Mg  Cpdr (Choline Fenofibrate) .... Take 1 Tab By Mouth At Bedtime 4)  Clonidine Hcl 0.2 Mg Tabs (Clonidine Hcl) .... Take 1 Tab By Mouth At Bedtime 5)  Aspir-Low 81 Mg Tbec (Aspirin) .... Take 1 Tablet By Mouth Once A Day 6)  Oscal 500/200 D-3 500-200 Mg-Unit Tabs (Calcium-Vitamin D) .... Take 1 Tablet By Mouth Three Times A Day 7)  Lipitor 80 Mg Tabs (Atorvastatin Calcium) .... Take 1 Tab By Mouth  At Bedtime  Allergies (verified): 1)  ! Diovan  Review of Systems      See HPI General:  Complains of fatigue. Eyes:  Denies blurring and discharge. MS:  Complains of joint pain; increased neck pain radiating don arms. Endo:  Denies cold intolerance, excessive hunger, excessive thirst, and excessive urination. Heme:  Denies abnormal bruising and bleeding. Allergy:  Complains of seasonal allergies; increased nasal congestion, post nasal drainage.  Physical Exam  General:  Well-developed,well-nourished,in no acute distress; alert,appropriate and cooperative throughout examination HEENT: No facial asymmetry,  EOMI, No sinus tenderness, TM's Clear, oropharynx  pink and moist.Decreased ROM cervical spine with spasm   Chest: Clear to auscultation bilaterally.  CVS: S1, S2, No murmurs, No S3.   Abd: Soft, Nontender.  MS: Adequate ROM spine, hips, shoulders and knees.  Ext: No edema.   CNS: CN 2-12 intact, power tone and sensation normal throughout.   Skin: Intact, no visible lesions or rashes.  Psych: Good eye contact, normal affect.  Memory intact, not anxious or depressed appearing.    Impression & Recommendations:  Problem # 1:  ALLERGIC RHINITIS CAUSE UNSPECIFIED (ICD-477.9) Assessment Deteriorated  Her updated medication list for this problem includes:    Flonase 50 Mcg/act Susp (Fluticasone propionate) .Marland Kitchen... 1 puff per nostril once daily  Orders: Medicare Electronic Prescription ([phone])  Problem # 2:  HYPERTENSION (ICD-401.9) Assessment: Deteriorated  Her updated medication  list for this problem includes:    Lisinopril-hydrochlorothiazide 20-12.5 Mg Tabs (Lisinopril-hydrochlorothiazide) .Marland Kitchen... Take two tablets by mouth once daily    Azor 10-40 Mg Tabs (Amlodipine-olmesartan) .Marland Kitchen... Take 1 tablet by mouth once a day    Clonidine Hcl 0.2 Mg Tabs (Clonidine hcl) .Marland Kitchen... Take 1 tab by mouth at bedtime Patient advised to follow low sodium diet rich in fruit and vegetables, and  to commit to at least 30 minutes 5 days per week of regular exercise , to improve blood presure control.   Orders: T-Basic Metabolic Panel ([phone])  BP today: 140/70 Prior BP: 120/80 (04/10/2010)  Labs Reviewed: K+: 3.9 (04/10/2010) Creat: : 1.33 (04/10/2010)   Chol: 172 (04/10/2010)   HDL: 63 (04/10/2010)   LDL: 96 (04/10/2010)   TG: 67 (04/10/2010)  Problem # 3:  HYPERLIPIDEMIA (ICD-272.4) Assessment: Comment Only  Her updated medication list for this problem includes:    Trilipix 135 Mg Cpdr (Choline fenofibrate) .Marland Kitchen... Take 1 tab by mouth at bedtime    Lipitor 80 Mg Tabs (Atorvastatin calcium) .Marland Kitchen... Take 1 tab by mouth at bedtime  Orders: T-Hepatic Function [phone]) T-Lipid Profile [phone]) Low fat dietdiscussed and encouraged  Labs Reviewed: SGOT: 17 (04/10/2010)   SGPT: <8 U/L (04/10/2010)   HDL:63 (04/10/2010), 72 (12/05/2009)  LDL:96 (04/10/2010), 158 (13/24/4010)  Chol:172 (04/10/2010), 241 (12/05/2009)  Trig:67 (04/10/2010), 55 (12/05/2009)  Problem # 4:  ARM PAIN (ICD-729.5) Assessment: Deteriorated likely due pathology in neck, pt to call for worsening symptoms  Complete Medication List: 1)  Lisinopril-hydrochlorothiazide 20-12.5 Mg Tabs (Lisinopril-hydrochlorothiazide) .... Take two tablets by mouth once daily 2)  Azor 10-40 Mg Tabs (Amlodipine-olmesartan) .... Take 1 tablet by mouth once a day 3)  Trilipix 135 Mg Cpdr (Choline fenofibrate) .... Take 1 tab by mouth at bedtime 4)  Clonidine Hcl 0.2 Mg Tabs (Clonidine hcl) .... Take 1 tab by mouth at bedtime 5)  Aspir-low 81 Mg Tbec (Aspirin) .... Take 1 tablet by mouth once a day 6)  Oscal 500/200 D-3 500-200 Mg-unit Tabs (Calcium-vitamin d) .... Take 1 tablet by mouth three times a day 7)  Lipitor 80 Mg Tabs (Atorvastatin calcium) .... Take 1 tab by mouth at bedtime 8)  Flonase 50 Mcg/act Susp (Fluticasone propionate) .Marland Kitchen.. 1 puff per nostril once daily  Other Orders: T-Vitamin D (25-Hydroxy)  [phone]) Pneumococcal Vaccine (34742) Admin 1st Vaccine (59563) Influenza Vaccine MCR [phone])  Patient Instructions: 1)  cPE end March 2)  BMP prior to visit, ICD-9: 3)  Hepatic Panel prior to visit, ICD-9: 4)  Lipid Panel prior to visit, ICD-9:  fasting today 5)  vitamin D 6)  The pain in your neck going down your left arm is likely due to arthrits and a pinced nerve, if it gets worse pls let me know Prescriptions: FLONASE 50 MCG/ACT SUSP (FLUTICASONE PROPIONATE) 1 puff per nostril once daily  #1 x 3   Entered and Authorized by:   Syliva Overman MD   Signed by:   Syliva Overman MD on 07/19/2010   Method used:   Electronically to        [facility]  [address] [address] [facility] (retail)       [address]       [place], [place]  [address]       Ph: [phone]       Fax: [phone]   RxID:   [id number]    Orders Added: 1)  Est. Patient Level IV [76283] 2)  T-Basic Metabolic Panel [[id number]] 3)  T-Hepatic Function [[id number]] 4)  T-Lipid Profile [80061-22930] 5)  T-Vitamin D (25-Hydroxy) [[id number]] 6)  Pneumococcal Vaccine [90732] 7)  Admin 1st Vaccine [90471] 8)  Influenza Vaccine MCR [00025] 9)  Medicare Electronic Prescription [G8553]   Immunizations Administered:  Pneumonia Vaccine:    Vaccine Type: Pneumovax    Site: right deltoid    Mfr: Merck    Dose: 0.5 ml    Route: IM    Given by: Adella Hare LPN    Exp. Date: 10/29/2011    Lot #: [id number]    VIS given: 07/17/09 version given July 19, 2010.  Influenza Vaccine # 1:    Vaccine Type: Fluvax MCR    Site: left deltoid    Mfr: GlaxoSmithKline    Dose: 0.5 ml    Route: IM    Given by: Adella Hare LPN    Exp. Date: 02/09/2011    Lot #: [id number]    VIS given: 03/06/10 version given July 19, 2010.   Immunizations Administered:  Pneumonia Vaccine:    Vaccine Type: Pneumovax    Site: right deltoid    Mfr: Merck    Dose: 0.5 ml    Route: IM    Given by: Adella Hare LPN     Exp. Date: 10/29/2011    Lot #: [id number]    VIS given: 07/17/09 version given July 19, 2010.  Influenza Vaccine # 1:    Vaccine Type: Fluvax MCR    Site: left deltoid    Mfr: GlaxoSmithKline    Dose: 0.5 ml    Route: IM    Given by: Adella Hare LPN    Exp. Date: 02/09/2011    Lot #: [id number]    VIS given: 03/06/10 version given July 19, 2010.

## 2010-10-24 LAB — POCT I-STAT 4, (NA,K, GLUC, HGB,HCT)
Glucose, Bld: 105 mg/dL — ABNORMAL HIGH (ref 70–99)
HCT: 35 % — ABNORMAL LOW (ref 36.0–46.0)
Hemoglobin: 11.9 g/dL — ABNORMAL LOW (ref 12.0–15.0)
Potassium: 3.4 mEq/L — ABNORMAL LOW (ref 3.5–5.1)
Sodium: 140 mEq/L (ref 135–145)

## 2010-12-17 ENCOUNTER — Other Ambulatory Visit: Payer: Self-pay | Admitting: Family Medicine

## 2010-12-17 DIAGNOSIS — Z139 Encounter for screening, unspecified: Secondary | ICD-10-CM

## 2010-12-25 NOTE — Assessment & Plan Note (Signed)
NAME:  Lindsay Robertson, Lindsay Robertson                CHART#:  [id number]   DATE:  04/17/2007                       DOB:  03/06/1946   REFERRING PHYSICIAN:  Milus Mallick. Lodema Hong, MD.   PROBLEM LIST:  1. Sigmoid colon polyp not retrieved in 2008.  2. Nausea and difficulty swallowing, resolved.  3. Hypertension.  4. Hyperlipidemia.   SUBJECTIVE:  [name] is a 65 year old female, who was initially seen  in July of 2008.  She was complaining of constipation and nausea and  weight loss.  She states her nausea is alright.  She is not having any  difficulty swallowing.  She was seen and evaluated by Ear, Nose, and  Throat for abnormal-appearing vocal cords, and they said that was  secondary to cough.  She has no specific questions, concerns, or  complaints today.   OBJECTIVE:  VITAL SIGNS:  Weight 146 pounds (unchanged since July of  2008), height 5 foot 4 inches, temperature 98.3, blood pressure 140/90,  pulse 72.  GENERAL:  In no apparent distress.  Alert and oriented x4.  LUNGS:  Clear to auscultation bilaterally.  CARDIOVASCULAR:  Regular rhythm, no murmur, normal S1 and S2.  ABDOMEN:  Bowel sounds are present, soft, nontender, and nondistended,  no rebound or guarding.   ASSESSMENT:  1. [name] is a 65 year old female, who had nausea and weight loss,      which has now resolved.  2. A screening colonoscopy in five years.  3. She may follow up with me as needed.       Kassie Mends, M.D.  Electronically Signed    SM/MEDQ  D:  04/17/2007  T:  04/17/2007  Job:  [id number]   cc:   Milus Mallick. Lodema Hong, M.D.

## 2010-12-25 NOTE — Letter (Signed)
July 24, 2007    Lindsay Robertson. Lodema Hong, M.D.  [address]  [place], [place] [address]   RE:  [name], [name]  MRN:  [id number]  /  DOB:  1945-10-08   Dear Lindsay Robertson:   It is my pleasure evaluating Lindsay Robertson in the office today in  consultation at your request. As you know, I previously saw this nice  woman in 2003 with chest discomfort. This did not appear to be cardiac  at the time and ultimately resolved. She was seen again by Dr. Anthonette Legato in  2005 with an episode of syncope. No specific etiology was identified.  She has done very well since then. Her principal vascular risk factors  of hypertension and hyperlipidemia have generally been adequately  managed. She has been off of Lipitor for approximately one year, but she  does not know why. This medication was recently restarted at a dose of  80 mg daily when her lipid profile was found to be suboptimal. Her only  other chronic medications are Metoprolol 25 mg daily and Clonidine 0.2  mg t.i.d.   Interim, past medical, social, and family history are unchanged. She  notes some weakness of her right leg on occasion with walking, but no  true claudication. She has never used tobacco products.   PHYSICAL EXAMINATION:  GENERAL:  Pleasant woman in no acute distress.  VITAL SIGNS:  Weight 143 pounds, 3 pounds less than in 02/2004. Blood  pressure 100/80, heart rate 60 and regular, respirations 18.  NECK:  No jugular venous distensions; normal carotid upstrokes without  bruits.  HEENT:  Pupils mildly myotic, but reactive to light; funduscopic  examination poorly visualizes. EOMs are full. Normal lids and  conjunctiva. Normal oral mucosa.  LUNGS:  Clear.  CARDIAC:  Normal first and second heart sounds; fourth heart sound  present.  ABDOMEN:  Soft and nontender; no organomegaly; no masses; normal bowel  sounds.  EXTREMITIES:  No edema; pulses are all palpable, but slightly less so on  the right. The right dorsalis pedis is triphasic and  normal by Doppler.  The right posterior tibial is slightly deformed and nearly monophasic.  SKIN:  Dry; no important lesions.   Recent laboratory is notable for a creatinine of 1.4, decreased from a  value of 1.8 in 2003. Total cholesterol is 284, triglycerides 62, HDL 73  and LDL 199.   IMPRESSION:  [name] is doing quite well in general. She has no  definite vascular disease, although the findings in the right leg could  reflect some subclinical atherosclerosis. I do not believe that she  requires any functional testing looking for asymptomatic vascular  disease.   She does have substantial hyperlipidemia. Atorvastatin 80 mg may well  bring her LDL to goal of less than 130, optimally less than 100. Control  of hypertension is excellent. 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 Accordingly, chronic anti-platelet  therapy is not necessarily warranted. Her renal insufficiency has  actually improved over the past 5 years, but still present to some  degree. I will leave further adjustment of lipid lowering therapy to  your discretion.   Please give me a call if you need any advice. Otherwise, I will plan to  reassess this nice woman in one year.    Sincerely,      Gerrit Friends. Dietrich Pates, MD, [facility]  Electronically Signed    RMR/MedQ  DD: 07/24/2007  DT: 07/26/2007  Job #: [id number]

## 2010-12-25 NOTE — Consult Note (Signed)
Lindsay Robertson, Lindsay Robertson               ACCOUNT NO.:  [id number]   MEDICAL RECORD NO.:  [id number]          PATIENT TYPE:  AMB   LOCATION:                                FACILITY:  [facility]   PHYSICIAN:  Kassie Mends, M.D.      DATE OF BIRTH:  [birth year]   DATE OF CONSULTATION:  02/17/2007  DATE OF DISCHARGE:                                 CONSULTATION   REFERRING PHYSICIAN:  Milus Mallick. Lodema Hong, M.D.   REASON FOR CONSULTATION:  Nausea.   HISTORY OF PRESENT ILLNESS:  Ms. Ey is a 65 year old female who has  been complaining of nausea after eating for the last 3 months.  Her  vomiting is precipitated by eating a large meal.  She last vomited on  July 4.  The nausea usually happens shortly after eating.  She sometimes  feels like things get stuck going down. It is only solid food that  bothers her.  She gets heartburn once a week.  She has treated that with  Maalox and Mylanta for the last year.  Her weight has decreased from 160  pounds to 142 pounds.  It was unintentional weight loss.  Her appetite  is good.  Sometimes when she cooks she loses her appetite and does not  eat.  She says that is why her husband told her that she is losing  weight.  She has two bowel movements a day which are soft.  She denies  any blood or stool or black tarry stools. She has had no problems with  her eyes turning yellow or abdominal pain.  Occasionally she may have a  hard stool.  She does not take any pain pills or BC or Goody powders.  She last used Aleve approximately 2 months ago.  She quit drinking  alcohol 4 years ago.   PAST MEDICAL HISTORY:  1. Hyperlipidemia.  2. Hypertension.   PAST SURGICAL HISTORY:  Possibly hemorrhoidectomy.   ALLERGIES:  No known drug allergies.   MEDICATIONS:  1. Clonidine 0.2 mg t.i.d.  2. Vytorin 10/80 daily.  3. Lisinopril/HCTZ 20/12.5 daily.   FAMILY HISTORY:  Her brother died of colon cancer at age 61.   SOCIAL HISTORY:  She is married and has one son age 30.   She is disabled  from a stroke in 1983.  She does not smoke.   REVIEW OF SYSTEMS:  Colonoscopy report from 2003:  Internal and external  hemorrhoids, otherwise normal. Per the HPI, otherwise all systems are  negative.   PHYSICAL EXAM:  VITAL SIGNS:  Weight 147 pounds, height 5 feet 4 inches,  BMI 25.2 (overweight) temperature 98.3, blood pressure 140/90, pulse 64.  GENERAL:  She is in no apparent distress, alert and oriented x4.  She  does have a baseline tremor.  HEENT:  Atraumatic, normocephalic.  Pupils equal and reactive to light.  Mouth no oral lesions.  Posterior pharynx without erythema or exudate  and she has moist mucosa.  NECK:  Her neck has full range of motion.  No  lymphadenopathy.  LUNGS:  Clear to auscultation bilaterally.  CARDIOVASCULAR:  Shows regular rhythm.  No murmur.  Normal S1-S2.  ABDOMEN:  Bowel sounds are present, soft, nontender, nondistended.  No  rebound or guarding, slightly obese, no hepatosplenomegaly, abdominal  bruits or pulsatile masses.  NEURO:  She has no focal neurologic  deficits.  EXTREMITIES:  Without cyanosis, clubbing or edema.   Labs from April 2008:  AST 31, ALT 16, total bili 0.6, albumin 4.4.   ASSESSMENT:  Ms. Cissell is a 66 year old female with nausea, early  satiety, and weight loss.  The differential diagnosis includes peptic  ulcer disease, gastroparesis and a low likelihood of gastric malignancy.  She has a personal history of polyps and her last colonoscopy was 5  years ago.  Thank you for allowing me to see Ms. Marina Goodell in consultation.  My recommendations follow.   RECOMMENDATIONS:  1. Ms. Nightengale will be scheduled for a colonoscopy, followed by an EGD      on July 10.  It will be performed to evaluate her nausea, weight      loss and for her poor personal history of polyps.  She will be      given Half-Lytely bowel prep.  2. Based on the findings on colonoscopy and the EGD, I will prescribe      medication for her nausea, and  intermittent heartburn.  She may      even require esophageal dilation if she has a stricture.  3. If the colonoscopy and upper endoscopy do not reveal any etiology      for her nausea and weight loss then a CT scan of the abdomen with      IV contrast will be performed.  She may also need a gastric      emptying study.  4. She has a follow-up appointment to see me in 6 weeks.      Kassie Mends, M.D.  Electronically Signed     SM/MEDQ  D:  02/17/2007  T:  02/18/2007  Job:  [id number]   cc:   Milus Mallick. Lodema Hong, M.D.  Fax: [phone]

## 2010-12-25 NOTE — Op Note (Signed)
Lindsay Robertson, Lindsay Robertson               ACCOUNT NO.:  [id number]   MEDICAL RECORD NO.:  [id number]          PATIENT TYPE:  AMB   LOCATION:  DAY                           FACILITY:  [facility]   PHYSICIAN:  Kassie Mends, M.D.      DATE OF BIRTH:  04-Jan-1946   DATE OF PROCEDURE:  02/19/2007  DATE OF DISCHARGE:                               OPERATIVE REPORT   PROCEDURE:  1. Colonoscopy with snare cautery polypectomy.  2. Esophagogastroduodenoscopy with a pediatric gastroscope.   INDICATIONS FOR EXAM:  Lindsay Robertson is a 65 year old female who presented  with nausea and weight loss.  She has a personal history of polyps.  Her  upper endoscopy is to evaluate the nausea and weight loss.  Her  colonoscopy is being performed for screening.   FINDINGS:  1. 5 mm sigmoid colon polyp removed via snare cautery.  The polyp was      not retrieved because it was obliterated during cautery.  Small      internal hemorrhoids.  Otherwise, no masses, inflammatory changes,      or AVMs.  2. 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  It is possible that [name] has a      stricture of her cervical esophagus, benign versus malignant.      Otherwise, normal appearing esophagus without evidence of      Barrett's, erosions, or mass.  3. Normal stomach and duodenum.   RECOMMENDATIONS:  1. Lindsay Robertson should have an evaluation by ENT as soon as possible to      have a biopsy and exam of her hypopharynx.  She may also need      dilation of her cervical esophageal stricture which may be causing      difficulty swallowing.  2. No aspirin, NSAIDs, or anticoagulation until evaluated by ENT.  3. Screening colonoscopy in five years.  4. She is given a handout on polyps.  She is not given a handout on      high fiber diet with her possible history of cervical stricture.  5. She should be on a soft mechanical diet.  She is given a handout on      soft mechanical diet.   MEDICATIONS:  1. Demerol 75 mg IV.  2. Versed 5 mg IV   PROCEDURE TECHNIQUE:  Physical exam was performed.  Informed consent was  obtained from the patient after explaining the benefits, risks and  alternatives to the procedure.  The patient was connected to the monitor  and placed in the left lateral position.  Continuous oxygen was provided  by nasal cannula and IV medicine administered through an indwelling  cannula.  After administration of sedation and rectal exam, the  patient's rectum was intubated and the scope was advanced under direct  visualization to the cecum.  The scope was removed slowly by carefully  examining the color, texture, anatomy and integrity of the mucosa on the  way out.   After the colonoscopy, the patient's esophagus was intubated with the  pediatric gastroscope.  I was unable to pass the adult gastroscope.  The  scope was advanced under direct visualization to the second portion of  the duodenum.  The scope was removed slowly by carefully examining the  color, texture, anatomy and integrity of the mucosa on the way out.  The  patient was recovered in endoscopy and discharged home in satisfactory  condition.      Kassie Mends, M.D.  Electronically Signed     SM/MEDQ  D:  02/19/2007  T:  02/19/2007  Job:  [id number]   cc:   Milus Mallick. Lodema Hong, M.D.  Fax: [phone]

## 2010-12-28 NOTE — Procedures (Signed)
NAME:  Lindsay Robertson, Lindsay Robertson                         ACCOUNT NO.:  [id number]   MEDICAL RECORD NO.:  [id number]                   PATIENT TYPE:  OUT   LOCATION:  RAD                                  FACILITY:  [facility]   PHYSICIAN:  Charlton Haws, M.D.                  DATE OF BIRTH:  1946-04-12   DATE OF PROCEDURE:  DATE OF DISCHARGE:                                  ECHOCARDIOGRAM   PROCEDURE:  A 2-D echocardiogram.   INDICATION:  Syncope.   FINDINGS:  1. Left ventricular cavity size was normal.  There was mild left ventricular     hypertrophy with a septal thickness of 12 to 13 mm.  2. Ejection fraction was normal at 60%.  3. There were no wall-motion abnormalities.  4. There was mild mitral valve thickening with trace mitral insufficiency.  5. There was mild left atrial enlargement.  6. The right-sided cardiac chambers were normal.  7. There was trivial TR.  8. No evidence of pulmonary hypertension.  9. The aortic valve was trileaflet with mild sclerosis.  There was no     stenosis or regurgitation.  The aortic root was normal.  10.      Subcostal imaging revealed no atrial septal defect, no source of     embolus and no effusion.   FINAL IMPRESSION:  1. Mild left ventricular hypertrophy.  Normal ejection fraction of 60%.  2. Normal right-sided cardiac chambers with no evidence of pulmonary     hypertension.  3. Mild mitral valve thickening with trivial mitral insufficiency.  4. Aortic valve sclerosis without stenosis.  5. No pericardial effusion.      ___________________________________________                                            Charlton Haws, M.D.   PN/MEDQ  D:  02/24/2004  T:  02/24/2004  Job:  [id number]   cc:   Milus Mallick. Lodema Hong, M.D.  [address]  [place], [place] [address]  Fax: [phone]   Vida Roller, M.D.  Fax: [phone]

## 2010-12-28 NOTE — H&P (Signed)
   NAME:  Lindsay Robertson, Lindsay Robertson                         ACCOUNT NO.:  [id number]   MEDICAL RECORD NO.:  [id number]                   PATIENT TYPE:  AMB   LOCATION:  DAY                                  FACILITY:  [facility]   PHYSICIAN:  Jerolyn Shin C. Katrinka Blazing, M.D.                DATE OF BIRTH:  December 06, 1945   DATE OF ADMISSION:  DATE OF DISCHARGE:                                HISTORY & PHYSICAL   HISTORY OF PRESENT ILLNESS:  [age]-year-old female with history of  colon polyps status post polypectomy times five in 2001.  The patient also  has epigastric pain and recurrent episodes of nausea without vomiting.  She  has a prior history of gastritis.  She is scheduled for upper and lower  endoscopies.   PAST HISTORY:  The patient has hypertension and hyperlipidemia, history of  TIA's and chronic anemia.   FAMILY HISTORY:  Positive for colon cancer and breast cancer.   REVIEW OF SYSTEMS:  Negative.   PHYSICAL EXAMINATION:   VITAL SIGNS:  Blood pressure 120/84, pulse 76, respirations 20 and weight  166 pounds.   HEENT:  Unremarkable.   NECK:  Supple without JVD or bruit.   CHEST:  Clear to auscultation.   HEART:  Regular rate and rhythm without murmur, gallop or rub.   ABDOMEN:  Soft and nontender with no masses.   RECTAL:  Normal.  Stool guaiac negative.   EXTREMITIES:  No clubbing, cyanosis or edema.   NEUROLOGIC EXAMINATION:  Unremarkable.   IMPRESSION:  1. History of colon polyps.  2. Family history of colon cancer.  3. Chronic anemia.  4. Peptic ulcer disease.  5. Hypertension.  6.     Hyperlipidemia.  7. History of transient ischemia attacks.   PLAN:  Upper and lower endoscopies.                                                 Dirk Dress. Katrinka Blazing, M.D.    LCS/MEDQ  D:  06/09/2002  T:  06/09/2002  Job:  [id number]

## 2010-12-28 NOTE — H&P (Signed)
NAMESHARLENA, Robertson NO.:  [id number]   MEDICAL RECORD NO.:  [id number]          PATIENT TYPE:  INP   LOCATION:  A319                          FACILITY:  [facility]   PHYSICIAN:  Lonia Blood, M.D.       DATE OF BIRTH:  1945/12/23   DATE OF ADMISSION:  01/08/2006  DATE OF DISCHARGE:  LH                                HISTORY & PHYSICAL   PRIMARY CARE PHYSICIAN:  Milus Mallick. Lodema Hong, M.D.   CHIEF COMPLAINT:  Low sodium level.   HISTORY OF PRESENT ILLNESS:  Lindsay Robertson is a 65 year old African-American  woman with history of hypertension, hyperlipidemia, lacunar CVAs, who  presented to her primary care physician's office for a routine visit on the  beginning of May, 2007.  The patient was found to have an elevated  creatinine level of 2.1 with a low sodium of 131.  For this reason the  patient was advised to increase her fluid intake and to come back for basic  metabolic profile check today.  On the visit today, Lindsay Robertson was found to  have a creatinine of 2.6 with a sodium level of 121.  So for this reason,  she was referred for admission.  The patient denies any chest pain.  Denies  shortness of breath.  Denies any headaches.  Denies confusion.  Denies  abdominal pain.  Denies nausea, denies vomiting.  The patient also reports  that she did not have any episodes of passing out.  This patient lives  alone, and there is a high concern for seizures, so the patient was referred  for admission to be observed in the hospital.   PAST MEDICAL HISTORY:  1.  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  2.  Hypertension.  3.  Hyperlipidemia.  4.  Lacunar CVAs.  5.  Mild to left ventricular hypertrophy.  6.  Cardiolite study done in 2003, which was within normal limits.  7.  Old right ankle fracture, status post open reduction/internal fixation.  8.  Patient does not have diabetes mellitus.   HOME MEDICATIONS:  1.  Diovan/HCTZ 80/25  mg by mouth daily.  2.  Clonidine 0.2 mg by mouth 3 times a day.  3.  Vytorin 10/80 mg per mouth daily.  4.  Plavix 75 mg per mouth daily.   SOCIAL HISTORY:  The patient lives alone.  She never smoked cigarettes.  Does not drink alcohol.  She is widowed.  She has a son who lives out of  town.  She is a retiree from the [employer].   FAMILY HISTORY:  Both parents are deceased.  Mother had breast cancer.  Father had severe diabetes with peripheral vascular disease.  The patient  had a total of seven siblings, six brothers and one sister.  Four of this  patient's brothers are deceased.  One of them had CVA, another one had colon  cancer.  Another one died from a massive heart attack.  A fourth brother  died when he was a child.  There  are two brothers living, one has  hypertension and hyperlipidemia.  Another one is reportedly healthy, but he  does not go to the doctor.  There is one sister living that has  hyperlipidemia and hypertension.   REVIEW OF SYSTEMS:  Positive for attacks of dizziness.  Positive for some  mild epigastric tenderness.  Positive for occasional cough with worsening  allergy-type symptoms, especially now with the dry weather.  Positive for  some knee pain.  Negative for chest pain.  Negative for shortness of breath.  Negative for dysuria.  Negative for constipation.  Other systems are as per  HPI.  All other systems are negative.   PHYSICAL EXAMINATION:  VITAL SIGNS:  Upon admission, patient's vital signs  are pending.  GENERAL APPEARANCE:  A well-developed and well-nourished African-American  woman  in no acute distress.  Alert and oriented to place, person, and time.  She does talk with a slight dysphonia, but apparently, it is chronic, per  the patient.  HEENT:  Head is normocephalic and atraumatic.  Eyes:  Pupils are equal,  round and reactive to light and accommodation.  Extraocular movements are  intact.  The patient's sclerae are anicteric.   Conjunctivae are pink.  The  patient's throat is clear.  She has some prominent tonsils, but they are not  inflamed.  NECK:  Supple without JVD.  CHEST:  Clear to auscultation bilaterally without wheezes, rhonchi, or  crackles.  HEART:  Regular rate and rhythm without murmurs, rubs or gallops.  ABDOMEN:  The patient's abdomen is obese, soft, nontender, nondistended.  Bowel sounds are present.  EXTREMITIES:  No edema.  SKIN:  Warm and dry without any suspicious rashes.  NEUROLOGIC:  Cranial nerves III-XII appear to be intact.  Strength is 5/5 in  all four extremities.  There is no gait abnormality.   LABORATORY VALUES:  Basic metabolic profile done today in Dr. Anthony Sar  office showed a sodium level of 121, a BUN of 43, and creatinine of 2.6.  Recent liver function tests are within normal limits.   ASSESSMENT/PLAN:  1.  Hyponatremia:  Most likely, this patient's hyponatremia is caused by her      hydrochlorothiazide use plus her massive free water intake.  This      patient has been drinking up to 8 liters of water a day.  At this point      in time, our plan is to hold the hydrochlorothiazide and gently hydrate      this patient with normal saline and closely follow up her basic      metabolic profile.  I will also obtain a serum osmolality level, a TSH      and cortisol level.  2.  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  This patient probably should use an ACE inhibitor or      an ARB on a daily basis but now with worsening creatinine function, I      would be reluctant to resume the Diovan.  I will monitor this patient's      creatinine closely after starting intravenous normal saline and obtain a      renal ultrasound and probable nephrology consultation if there is no      improvement.  3.  Hypertension:  Will continue this patient's clonidine and also add     hydralazine if the blood pressure tolerates.  She does need very  good  systolic blood pressure control if this is possible.  4.  Hyperlipidemia:  Will continue this patient on Vytorin.  5.  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  6.  Deep venous thrombosis prophylaxis will be done using Lovenox.  7.  Retrosternal burning, most likely secondary to gastroesophageal reflux      disease:  We will start this patient on a proton pump inhibitor.      Lonia Blood, M.D.  Electronically Signed     SL/MEDQ  D:  01/08/2006  T:  01/08/2006  Job:  [id number]   cc:   Milus Mallick. Lodema Hong, M.D.  Fax: [phone]

## 2010-12-28 NOTE — Group Therapy Note (Signed)
   NAMEBUNA, CUPPETT NO.:  [id number]   MEDICAL RECORD NO.:  [id number]                  PATIENT TYPE:   LOCATION:                                       FACILITY:   PHYSICIAN:  Willa Rough, M.D. [facility]              DATE OF BIRTH:   DATE OF PROCEDURE:  07/02/2002  DATE OF DISCHARGE:                                   PROGRESS NOTE   ADENOSINE CARDIOLITE:   BRIEF HISTORY:  The patient is a pleasant 65 year old female seen in the  office recently by Dr. Myrtis Ser for  evaluation of chest tightness, dyspnea on  exertion and fatigue.  The patient  has a history of having had a CVA in  52.  She had a 2-D echo performed in 1998 which was reportedly normal.  She has elevated cholesterol levels and history of hypertension.  There is  no known history of coronary artery disease.  She was scheduled for an  adenosine Cardiolite to further evaluate her symptoms.   Prior to the study, the patient reports no recent chest pain or shortness of  breath.  Her baseline line EKG showed sinus rhythm rate 59 beats/minute  without ischemic changes.  Resting blood pressure is 120/70.   Adenosine was administered minutes one through four.  Cardiolite was given  at three minutes.  The patient  did experience some chest tightness, some  burning and some shortness of breath.   The EKG showed inferior lateral T-wave changes with PACs.   The patient's symptoms resolved in recovery.  The images are pending at the  time of this dictation.     Delton See, P.A. [facility]                  Willa Rough, M.D. [facility]    DR/MEDQ  D:  07/02/2002  T:  07/02/2002  Job:  [id number]   cc:   Milus Mallick. Lodema Hong, M.D.  [address]  [place]  [place] [address]  Fax: [phone]

## 2011-01-18 ENCOUNTER — Encounter: Payer: Self-pay | Admitting: Family Medicine

## 2011-01-21 ENCOUNTER — Ambulatory Visit (HOSPITAL_COMMUNITY)
Admission: RE | Admit: 2011-01-21 | Discharge: 2011-01-21 | Disposition: A | Discharge status: Home / Self Care | Payer: Medicare Other | Source: Ambulatory Visit | Attending: Family Medicine | Admitting: Family Medicine

## 2011-01-21 DIAGNOSIS — Z1231 Encounter for screening mammogram for malignant neoplasm of breast: Secondary | ICD-10-CM | POA: Insufficient documentation

## 2011-01-21 DIAGNOSIS — Z139 Encounter for screening, unspecified: Secondary | ICD-10-CM

## 2011-01-24 ENCOUNTER — Ambulatory Visit (INDEPENDENT_AMBULATORY_CARE_PROVIDER_SITE_OTHER): Payer: Medicare Other | Admitting: Family Medicine

## 2011-01-24 ENCOUNTER — Encounter: Payer: Self-pay | Admitting: Family Medicine

## 2011-01-24 ENCOUNTER — Other Ambulatory Visit (HOSPITAL_COMMUNITY)
Admission: RE | Admit: 2011-01-24 | Discharge: 2011-01-24 | Disposition: A | Discharge status: Home / Self Care | Payer: Medicare Other | Source: Ambulatory Visit | Attending: Family Medicine | Admitting: Family Medicine

## 2011-01-24 VITALS — BP 120/64 | HR 68 | Resp 16 | Ht 65.0 in | Wt 162.0 lb

## 2011-01-24 DIAGNOSIS — Z01419 Encounter for gynecological examination (general) (routine) without abnormal findings: Secondary | ICD-10-CM

## 2011-01-24 DIAGNOSIS — R102 Pelvic and perineal pain unspecified side: Secondary | ICD-10-CM

## 2011-01-24 DIAGNOSIS — I10 Essential (primary) hypertension: Secondary | ICD-10-CM

## 2011-01-24 DIAGNOSIS — Z0000 Encounter for general adult medical examination without abnormal findings: Secondary | ICD-10-CM

## 2011-01-24 DIAGNOSIS — Z1211 Encounter for screening for malignant neoplasm of colon: Secondary | ICD-10-CM

## 2011-01-24 DIAGNOSIS — R5381 Other malaise: Secondary | ICD-10-CM

## 2011-01-24 DIAGNOSIS — R5383 Other fatigue: Secondary | ICD-10-CM

## 2011-01-24 DIAGNOSIS — N760 Acute vaginitis: Secondary | ICD-10-CM

## 2011-01-24 DIAGNOSIS — Z124 Encounter for screening for malignant neoplasm of cervix: Secondary | ICD-10-CM | POA: Insufficient documentation

## 2011-01-24 DIAGNOSIS — B369 Superficial mycosis, unspecified: Secondary | ICD-10-CM

## 2011-01-24 DIAGNOSIS — E785 Hyperlipidemia, unspecified: Secondary | ICD-10-CM

## 2011-01-24 DIAGNOSIS — R7301 Impaired fasting glucose: Secondary | ICD-10-CM

## 2011-01-24 DIAGNOSIS — N949 Unspecified condition associated with female genital organs and menstrual cycle: Secondary | ICD-10-CM

## 2011-01-24 LAB — POC HEMOCCULT BLD/STL (OFFICE/1-CARD/DIAGNOSTIC): Fecal Occult Blood, POC: NEGATIVE

## 2011-01-24 MED ORDER — CLOTRIMAZOLE-BETAMETHASONE 1-0.05 % EX CREA: TOPICAL_CREAM | Freq: Two times a day (BID) | CUTANEOUS | Status: DC

## 2011-01-24 NOTE — Patient Instructions (Addendum)
F/u in 4 months.  You will be referred for a  Test to evaluate pelvic pain  Fasting labs due.  No med changes

## 2011-01-26 LAB — LIPID PANEL
Cholesterol: 199 mg/dL (ref 0–200)
HDL: 48 mg/dL (ref >39)
Triglycerides: 55 mg/dL (ref <150)

## 2011-01-26 LAB — HEPATIC FUNCTION PANEL
Albumin: 4.5 g/dL (ref 3.5–5.2)
Total Bilirubin: 0.6 mg/dL (ref 0.3–1.2)
Total Protein: 8.3 g/dL (ref 6.0–8.3)

## 2011-01-26 LAB — CBC WITH DIFFERENTIAL/PLATELET
Basophils Absolute: 0 10*3/uL (ref 0.0–0.1)
Basophils Relative: 0 % (ref 0–1)
Eosinophils Absolute: 0.2 10*3/uL (ref 0.0–0.7)
Eosinophils Relative: 3 % (ref 0–5)
MCH: 27 pg (ref 26.0–34.0)
MCV: 83.9 fL (ref 78.0–100.0)
Neutrophils Relative %: 43 % (ref 43–77)
Platelets: 221 10*3/uL (ref 150–400)
RDW: 15.6 % — ABNORMAL HIGH (ref 11.5–15.5)

## 2011-01-26 LAB — GC/CHLAMYDIA PROBE AMP, GENITAL
Chlamydia, DNA Probe: NEGATIVE
GC Probe Amp, Genital: NEGATIVE

## 2011-01-26 LAB — BASIC METABOLIC PANEL
BUN: 23 mg/dL (ref 6–23)
Chloride: 105 mEq/L (ref 96–112)
Creat: 1.35 mg/dL — ABNORMAL HIGH (ref 0.50–1.10)
Glucose, Bld: 108 mg/dL — ABNORMAL HIGH (ref 70–99)
Potassium: 4.4 mEq/L (ref 3.5–5.3)

## 2011-01-26 LAB — WET PREP BY MOLECULAR PROBE: Candida species: NEGATIVE

## 2011-02-03 ENCOUNTER — Encounter: Payer: Self-pay | Admitting: Family Medicine

## 2011-02-03 DIAGNOSIS — R102 Pelvic and perineal pain: Secondary | ICD-10-CM | POA: Insufficient documentation

## 2011-02-03 NOTE — Assessment & Plan Note (Signed)
New complaint will refer for pelvic  US

## 2011-02-03 NOTE — Assessment & Plan Note (Signed)
Controlled, no change in medication  

## 2011-02-03 NOTE — Progress Notes (Signed)
  Subjective:    Patient ID: Lindsay Robertson, female    DOB: 06-Feb-1946, 65 y.o.   MRN: [id number]  HPI The PT is here for annual exam and re-evaluation of chronic medical conditions, medication management and review of recent lab and radiology data.  Preventive health is updated, specifically  Cancer screening, Osteoporosis screening and Immunization.   Questions or concerns regarding consultations or procedures which the PT has had in the interim are  addressed. The PT denies any adverse reactions to current medications since the last visit.   C/o pelvic pain     Review of Systems Denies recent fever or chills. Denies sinus pressure, nasal congestion, ear pain or sore throat. Denies chest congestion, productive cough or wheezing. Denies chest pains, palpitations, paroxysmal nocturnal dyspnea, orthopnea and leg swelling Denies abdominal pain, nausea, vomiting,diarrhea or constipation.  Denies rectal bleeding or change in bowel movement. Denies dysuria, frequency, hesitancy or incontinence. Denies joint pain, swelling and limitation in mobility. Denies headaches, seizure, numbness, or tingling. Denies depression, anxiety or insomnia. Denies skin break down or rash.        Objective:   Physical Exam    Pleasant well nourished female, alert and oriented x 3, in no cardio-pulmonary distress. Afebrile. HEENT No facial trauma or asymetry. No sinus tenderness  EOMI, PERTL, fundoscopic exam is normal, no hemorhage or exudate. No papiledema External ears normal, tympanic membranes clear. Oropharynx moist, no exudate, good dentition. Neck: supple, no adenopathy,JVD or thyromegaly.No bruits.  Chest: Clear to ascultation bilaterally.No crackles or wheezes. Non tender to palpation  Breast: No asymetry,no masses. No nipple discharge or inversion. No axillary or supraclavicular adenopathy  Cardiovascular system; Heart sounds normal,  S1 and  S2 ,no S3.  No murmur, or  thrill. Apical beat not displaced Peripheral pulses normal.  Abdomen: Soft, non tender, no organomegaly or masses. No bruits. Bowel sounds normal. No guarding, tenderness or rebound.  Rectal:  No mass. guaic negative stool.  GU: External genitalia normal. No lesions. Vaginal canal normal.No discharge. Uterus normal size, possible adnexal mass, no cervical motion or adnexal tenderness.  Musculoskeletal exam: Full ROM of spine, hips , shoulders and knees. No deformity ,swelling or crepitus noted. No muscle wasting or atrophy.   Neurologic: Cranial nerves 2 to 12 intact. Power, tone ,sensation and reflexes normal throughout. No disturbance in gait. No tremor.  Skin: Intact, no ulceration, erythema , scaling or rash noted. Pigmentation normal throughout  Psych; Normal mood and affect. Judgement and concentration normal     Assessment & Plan:

## 2011-02-03 NOTE — Assessment & Plan Note (Signed)
Uncontrolled , med compliance is an issue pt unable to obtain med at times though she is on a program where she gets her medication free

## 2011-02-04 ENCOUNTER — Other Ambulatory Visit: Payer: Self-pay | Admitting: Family Medicine

## 2011-02-04 DIAGNOSIS — R102 Pelvic and perineal pain: Secondary | ICD-10-CM

## 2011-02-12 ENCOUNTER — Ambulatory Visit (HOSPITAL_COMMUNITY): Payer: Medicare Other

## 2011-02-12 ENCOUNTER — Ambulatory Visit (HOSPITAL_COMMUNITY)
Admission: RE | Admit: 2011-02-12 | Discharge: 2011-02-12 | Disposition: A | Discharge status: Home / Self Care | Payer: Medicare Other | Source: Ambulatory Visit | Attending: Family Medicine | Admitting: Family Medicine

## 2011-02-12 DIAGNOSIS — R102 Pelvic and perineal pain: Secondary | ICD-10-CM

## 2011-02-12 DIAGNOSIS — Z780 Asymptomatic menopausal state: Secondary | ICD-10-CM | POA: Insufficient documentation

## 2011-02-12 DIAGNOSIS — N949 Unspecified condition associated with female genital organs and menstrual cycle: Secondary | ICD-10-CM | POA: Insufficient documentation

## 2011-02-12 DIAGNOSIS — R9389 Abnormal findings on diagnostic imaging of other specified body structures: Secondary | ICD-10-CM | POA: Insufficient documentation

## 2011-04-30 ENCOUNTER — Other Ambulatory Visit: Payer: Self-pay

## 2011-04-30 ENCOUNTER — Encounter (HOSPITAL_COMMUNITY): Payer: Self-pay | Admitting: Emergency Medicine

## 2011-04-30 ENCOUNTER — Encounter: Payer: Self-pay | Admitting: Family Medicine

## 2011-04-30 ENCOUNTER — Inpatient Hospital Stay (HOSPITAL_COMMUNITY)
Admission: EM | Admit: 2011-04-30 | Discharge: 2011-05-03 | DRG: 066 | Disposition: A | Discharge status: Home / Self Care | Payer: Medicare Other | Attending: Internal Medicine | Admitting: Internal Medicine

## 2011-04-30 ENCOUNTER — Ambulatory Visit (INDEPENDENT_AMBULATORY_CARE_PROVIDER_SITE_OTHER): Payer: Medicare Other | Admitting: Family Medicine

## 2011-04-30 ENCOUNTER — Emergency Department (HOSPITAL_COMMUNITY): Payer: Medicare Other

## 2011-04-30 DIAGNOSIS — R410 Disorientation, unspecified: Secondary | ICD-10-CM

## 2011-04-30 DIAGNOSIS — I639 Cerebral infarction, unspecified: Secondary | ICD-10-CM | POA: Diagnosis present

## 2011-04-30 DIAGNOSIS — E869 Volume depletion, unspecified: Secondary | ICD-10-CM | POA: Diagnosis present

## 2011-04-30 DIAGNOSIS — I6350 Cerebral infarction due to unspecified occlusion or stenosis of unspecified cerebral artery: Principal | ICD-10-CM | POA: Diagnosis present

## 2011-04-30 DIAGNOSIS — E785 Hyperlipidemia, unspecified: Secondary | ICD-10-CM | POA: Diagnosis present

## 2011-04-30 DIAGNOSIS — I10 Essential (primary) hypertension: Secondary | ICD-10-CM | POA: Diagnosis present

## 2011-04-30 DIAGNOSIS — N289 Disorder of kidney and ureter, unspecified: Secondary | ICD-10-CM | POA: Diagnosis present

## 2011-04-30 DIAGNOSIS — F05 Delirium due to known physiological condition: Secondary | ICD-10-CM

## 2011-04-30 LAB — COMPREHENSIVE METABOLIC PANEL
ALT: 12 U/L (ref 0–35)
AST: 22 U/L (ref 0–37)
Albumin: 3.9 g/dL (ref 3.5–5.2)
Alkaline Phosphatase: 45 U/L (ref 39–117)
Glucose, Bld: 157 mg/dL — ABNORMAL HIGH (ref 70–99)
Potassium: 3.6 mEq/L (ref 3.5–5.1)
Sodium: 135 mEq/L (ref 135–145)
Total Protein: 7.8 g/dL (ref 6.0–8.3)

## 2011-04-30 LAB — DIFFERENTIAL
Basophils Absolute: 0 10*3/uL (ref 0.0–0.1)
Eosinophils Absolute: 0.1 10*3/uL (ref 0.0–0.7)
Lymphs Abs: 1.9 10*3/uL (ref 0.7–4.0)
Neutrophils Relative %: 57 % (ref 43–77)

## 2011-04-30 LAB — RAPID URINE DRUG SCREEN, HOSP PERFORMED
Amphetamines: NOT DETECTED
Barbiturates: NOT DETECTED
Benzodiazepines: NOT DETECTED
Tetrahydrocannabinol: NOT DETECTED

## 2011-04-30 LAB — URINALYSIS, ROUTINE W REFLEX MICROSCOPIC
Bilirubin Urine: NEGATIVE
Ketones, ur: NEGATIVE mg/dL
Leukocytes, UA: NEGATIVE
Nitrite: NEGATIVE
Protein, ur: 30 mg/dL — AB

## 2011-04-30 LAB — FOLATE: Folate: 5.7 ng/mL

## 2011-04-30 LAB — IRON AND TIBC
Iron: 83 ug/dL (ref 42–135)
Saturation Ratios: 24 % (ref 20–55)
UIBC: 266 ug/dL (ref 125–400)

## 2011-04-30 LAB — CBC
MCH: 27.6 pg (ref 26.0–34.0)
Platelets: 250 10*3/uL (ref 150–400)
RBC: 3.98 MIL/uL (ref 3.87–5.11)
WBC: 7 10*3/uL (ref 4.0–10.5)

## 2011-04-30 LAB — FERRITIN: Ferritin: 400 ng/mL — ABNORMAL HIGH (ref 10–291)

## 2011-04-30 LAB — CARDIAC PANEL(CRET KIN+CKTOT+MB+TROPI)
Relative Index: 0.8 (ref 0.0–2.5)
Troponin I: <0.3 ng/mL (ref <0.30)

## 2011-04-30 MED ORDER — POTASSIUM CHLORIDE 20 MEQ/15ML (10%) PO LIQD
40.0000 meq | Freq: Once | ORAL | Status: AC
  Administered 2011-05-01: 40 meq via ORAL
  Filled 2011-04-30: qty 30

## 2011-04-30 MED ORDER — ENOXAPARIN SODIUM 30 MG/0.3ML ~~LOC~~ SOLN
30.0000 mg | SUBCUTANEOUS | Status: DC
  Administered 2011-05-01 – 2011-05-02 (×2): 30 mg via SUBCUTANEOUS
  Filled 2011-04-30 (×2): qty 0.3

## 2011-04-30 MED ORDER — PANTOPRAZOLE SODIUM 40 MG PO TBEC
40.0000 mg | DELAYED_RELEASE_TABLET | Freq: Every day | ORAL | Status: DC
  Administered 2011-05-01 – 2011-05-02 (×2): 40 mg via ORAL
  Filled 2011-04-30 (×2): qty 1

## 2011-04-30 MED ORDER — SIMVASTATIN 40 MG PO TABS: 40.0000 mg | ORAL_TABLET | Freq: Every day | ORAL | Status: DC

## 2011-04-30 MED ORDER — INFLUENZA VIRUS VACC SPLIT PF IM SUSP
0.5000 mL | Freq: Once | INTRAMUSCULAR | Status: AC
  Administered 2011-05-01: 0.5 mL via INTRAMUSCULAR
  Filled 2011-04-30: qty 0.5

## 2011-04-30 MED ORDER — ROSUVASTATIN CALCIUM 20 MG PO TABS
40.0000 mg | ORAL_TABLET | Freq: Every day | ORAL | Status: DC
Start: 2011-04-30 — End: 2011-05-03
  Administered 2011-05-01 – 2011-05-02 (×3): 40 mg via ORAL
  Filled 2011-04-30 (×2): qty 2
  Filled 2011-04-30: qty 1
  Filled 2011-04-30: qty 2

## 2011-04-30 MED ORDER — CALCIUM CARBONATE-VITAMIN D 500-200 MG-UNIT PO TABS
1.0000 | ORAL_TABLET | Freq: Every day | ORAL | Status: DC
  Administered 2011-05-01 – 2011-05-03 (×4): 1 via ORAL
  Filled 2011-04-30 (×5): qty 1

## 2011-04-30 MED ORDER — FENOFIBRATE 160 MG PO TABS
160.0000 mg | ORAL_TABLET | Freq: Every day | ORAL | Status: DC
  Administered 2011-05-01 – 2011-05-03 (×3): 160 mg via ORAL
  Filled 2011-04-30 (×4): qty 1

## 2011-04-30 MED ORDER — ACETAMINOPHEN 325 MG PO TABS: 650.0000 mg | ORAL_TABLET | ORAL | Status: DC | PRN

## 2011-04-30 MED ORDER — SODIUM CHLORIDE 0.9 % IV SOLN
INTRAVENOUS | Status: DC
  Administered 2011-05-01 – 2011-05-02 (×4): via INTRAVENOUS

## 2011-04-30 MED ORDER — CLONIDINE HCL 0.1 MG PO TABS
0.1000 mg | ORAL_TABLET | Freq: Three times a day (TID) | ORAL | Status: DC
  Administered 2011-05-01 – 2011-05-03 (×8): 0.1 mg via ORAL
  Filled 2011-04-30 (×8): qty 1

## 2011-04-30 MED ORDER — AMLODIPINE BESYLATE 5 MG PO TABS
10.0000 mg | ORAL_TABLET | Freq: Every day | ORAL | Status: DC
  Administered 2011-05-01 – 2011-05-03 (×3): 10 mg via ORAL
  Filled 2011-04-30 (×3): qty 2

## 2011-04-30 MED ORDER — CLOPIDOGREL BISULFATE 75 MG PO TABS
75.0000 mg | ORAL_TABLET | Freq: Every day | ORAL | Status: DC
  Administered 2011-05-01 – 2011-05-03 (×3): 75 mg via ORAL
  Filled 2011-04-30 (×4): qty 1

## 2011-04-30 NOTE — ED Notes (Signed)
Refused to take report will continue to call

## 2011-04-30 NOTE — ED Notes (Signed)
On MD assessment, pt alert and oriented to self, place and situation. Pt able to follow commands.

## 2011-04-30 NOTE — ED Notes (Signed)
Pt is confused since last night and about 2am she started having diarrhea, vomiting and states she feels weak.

## 2011-04-30 NOTE — H&P (Signed)
Lindsay Robertson is an 65 y.o. female.   Chief Complaint: Confusion, right sided weakness since this morning. HPI: This pleasant 65 year old female with hx of htn/hyperlipidermia, came in with confusion since this morning. Her husband says she went to bed Ok last night, but patient acted confused while coming up the stairs from their kitchen. She was reportedly not herself and seems to be weak on the right side. Her speech seemed slurred. She also felt nauseated, and had a couple bowel movements of loose stool. She denies hematochezia/melena or change in stool color. No headaches or fever. Her husband took her to Dr simpson's office, where she was referred to the ER. Per her husband, she was still not herself at the office, but she is now more at baseline. 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 She has been taking a baby aspirin daily, and says her blood pressure is generally controlled.  Past Medical History  Diagnosis Date  . Hyperlipidemia   . Hypertension   . Allergy     Past Surgical History  Procedure Date  . Cataract extraction     right eye  . Tubal ligation     Family History  Problem Relation Age of Onset  . Cancer Mother     breast  . Hypertension Mother   . Diabetes Father   . Hypertension Father   . Diabetes Sister   . Diabetes Brother   . Stroke Maternal Grandmother   . Hypertension Maternal Grandmother   . Diabetes Paternal Grandmother   . Cancer Brother     colon x 1  . Heart disease Brother     massive heart attack x 1  . Diabetes Brother     x1   Social History:  reports that she has never smoked. She does not have any smokeless tobacco history on file. She reports that she does not drink alcohol or use illicit drugs.  Allergies: No Known Allergies  Medications Prior to Admission  Medication Dose Route Frequency Provider Last Rate Last Dose  . 0.9 %  sodium chloride infusion   Intravenous Continuous Nicholes Stairs, MD      . acetaminophen (TYLENOL) tablet 650 mg  650 mg Oral Q4H PRN Emanuelle Bastos      . amLODipine (NORVASC) tablet 10 mg  10 mg Oral Daily Yaiza Palazzola      . calcium-vitamin D (OSCAL WITH D) 500-200 MG-UNIT per tablet 1 tablet  1 tablet Oral Daily Ethleen Lormand      . cloNIDine (CATAPRES) tablet 0.1 mg  0.1 mg Oral TID Rutherford Alarie      . clopidogrel (PLAVIX) tablet 75 mg  75 mg Oral Q breakfast Giankarlo Leamer      . enoxaparin (LOVENOX) injection 30 mg  30 mg Subcutaneous Q24H Pilar Westergaard      . fenofibrate tablet 160 mg  160 mg Oral Daily Janna Oak      . pantoprazole (PROTONIX) EC tablet 40 mg  40 mg Oral Q1200 Katherinne Mofield      . rosuvastatin (CRESTOR) tablet 40 mg  40 mg Oral q1800 Nicholes Stairs, MD      . DISCONTD: simvastatin (ZOCOR) tablet 40 mg  40 mg Oral Daily Rockie Schnoor       Medications Prior to Admission  Medication Sig Dispense Refill  . amLODipine-olmesartan (AZOR) 10-40 MG per tablet Take 1 tablet by mouth daily.        Marland Kitchen  aspirin 81 MG tablet Take 81 mg by mouth daily.        Marland Kitchen atorvastatin (LIPITOR) 80 MG tablet Take 80 mg by mouth daily.        . calcium-vitamin D (OSCAL WITH D) 500-200 MG-UNIT per tablet Take 1 tablet by mouth daily.        . Choline Fenofibrate (TRILIPIX) 135 MG capsule Take 135 mg by mouth daily.        . cloNIDine (CATAPRES) 0.2 MG tablet Take 0.2 mg by mouth daily.        Marland Kitchen lisinopril-hydrochlorothiazide (PRINZIDE,ZESTORETIC) 10-12.5 MG per tablet Take 1 tablet by mouth daily.        . clotrimazole-betamethasone (LOTRISONE) cream Apply topically 2 (two) times daily.  45 g  2    Results for orders placed during the hospital encounter of 04/30/11 (from the past 48 hour(s))  CBC     Status: Abnormal   Collection Time   04/30/11  2:55 PM      Component Value Range Comment   WBC 7.0  4.0 - 10.5 (K/uL)    RBC 3.98  3.87 - 5.11 (MIL/uL)    Hemoglobin 11.0 (*) 12.0 - 15.0 (g/dL)    HCT 16.1 (*) 09.6 - 46.0 (%)    MCV  81.7  78.0 - 100.0 (fL)    MCH 27.6  26.0 - 34.0 (pg)    MCHC 33.8  30.0 - 36.0 (g/dL)    RDW 04.5 (*) 40.9 - 15.5 (%)    Platelets 250  150 - 400 (K/uL)   DIFFERENTIAL     Status: Abnormal   Collection Time   04/30/11  2:55 PM      Component Value Range Comment   Neutrophils Relative 57  43 - 77 (%)    Neutro Abs 4.0  1.7 - 7.7 (K/uL)    Lymphocytes Relative 27  12 - 46 (%)    Lymphs Abs 1.9  0.7 - 4.0 (K/uL)    Monocytes Relative 14 (*) 3 - 12 (%)    Monocytes Absolute 1.0  0.1 - 1.0 (K/uL)    Eosinophils Relative 1  0 - 5 (%)    Eosinophils Absolute 0.1  0.0 - 0.7 (K/uL)    Basophils Relative 0  0 - 1 (%)    Basophils Absolute 0.0  0.0 - 0.1 (K/uL)   COMPREHENSIVE METABOLIC PANEL     Status: Abnormal   Collection Time   04/30/11  2:55 PM      Component Value Range Comment   Sodium 135  135 - 145 (mEq/L)    Potassium 3.6  3.5 - 5.1 (mEq/L)    Chloride 97  96 - 112 (mEq/L)    CO2 26  19 - 32 (mEq/L)    Glucose, Bld 157 (*) 70 - 99 (mg/dL)    BUN 25 (*) 6 - 23 (mg/dL)    Creatinine, Ser 8.11 (*) 0.50 - 1.10 (mg/dL)    Calcium 91.4  8.4 - 10.5 (mg/dL)    Total Protein 7.8  6.0 - 8.3 (g/dL)    Albumin 3.9  3.5 - 5.2 (g/dL)    AST 22  0 - 37 (U/L)    ALT 12  0 - 35 (U/L)    Alkaline Phosphatase 45  39 - 117 (U/L)    Total Bilirubin 0.5  0.3 - 1.2 (mg/dL)    GFR calc non Af Amer 36 (*) >60 (mL/min)    GFR calc Af Amer 43 (*) >  60 (mL/min)   ETHANOL     Status: Normal   Collection Time   04/30/11  2:55 PM      Component Value Range Comment   Alcohol, Ethyl (B) <11  0 - 11 (mg/dL)   AMMONIA     Status: Abnormal   Collection Time   04/30/11  2:55 PM      Component Value Range Comment   Ammonia <10 (*) 11 - 60 (umol/L)   URINE RAPID DRUG SCREEN (HOSP PERFORMED)     Status: Normal   Collection Time   04/30/11  3:38 PM      Component Value Range Comment   Opiates NONE DETECTED  NONE DETECTED     Cocaine NONE DETECTED  NONE DETECTED     Benzodiazepines NONE DETECTED  NONE  DETECTED     Amphetamines NONE DETECTED  NONE DETECTED     Tetrahydrocannabinol NONE DETECTED  NONE DETECTED     Barbiturates NONE DETECTED  NONE DETECTED    URINALYSIS, ROUTINE W REFLEX MICROSCOPIC     Status: Abnormal   Collection Time   04/30/11  3:38 PM      Component Value Range Comment   Color, Urine YELLOW  YELLOW     Appearance CLEAR  CLEAR     Specific Gravity, Urine 1.025  1.005 - 1.030     pH 5.5  5.0 - 8.0     Glucose, UA NEGATIVE  NEGATIVE (mg/dL)    Hgb urine dipstick NEGATIVE  NEGATIVE     Bilirubin Urine NEGATIVE  NEGATIVE     Ketones, ur NEGATIVE  NEGATIVE (mg/dL)    Protein, ur 30 (*) NEGATIVE (mg/dL)    Urobilinogen, UA 0.2  0.0 - 1.0 (mg/dL)    Nitrite NEGATIVE  NEGATIVE     Leukocytes, UA NEGATIVE  NEGATIVE    URINE MICROSCOPIC-ADD ON     Status: Abnormal   Collection Time   04/30/11  3:38 PM      Component Value Range Comment   Squamous Epithelial / LPF FEW (*) RARE     WBC, UA 0-2  <3 (WBC/hpf)    Bacteria, UA FEW (*) RARE     Casts HYALINE CASTS (*) NEGATIVE     Ct Head Wo Contrast  04/30/2011  *RADIOLOGY REPORT*  Clinical Data: Confusion, altered mental status, weakness, hypertension, nausea, vomiting, diarrhea  CT HEAD WITHOUT CONTRAST  Technique:  Contiguous axial images were obtained from the base of the skull through the vertex without contrast.  Comparison: None  Findings: Generalized atrophy. Normal ventricular morphology. No midline shift or mass effect. Small vessel chronic ischemic changes of deep cerebral white matter. Old appearing left thalamic lacunar infarct. Question artifact versus old brain stem infarct at medulla on image 6. No intracranial hemorrhage, mass lesion, or evidence of acute infarction. Visualized paranasal sinuses and mastoid air cells clear. Bones unremarkable.  IMPRESSION: Atrophy with small vessel chronic ischemic changes of deep cerebral white matter. 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 No acute  intracranial abnormalities.  Original Report Authenticated By: Lollie Marrow, M.D.   Dg Chest Portable 1 View  04/30/2011  *RADIOLOGY REPORT*  Clinical Data: TIA  PORTABLE CHEST - 1 VIEW  Comparison: 01/26/2007  Findings: Lungs are clear. No pleural effusion or pneumothorax.  The heart is top normal in size.  IMPRESSION: No evidence of acute cardiopulmonary disease.  Original Report Authenticated By: Charline Bills, M.D.    Review of Systems  HENT: Negative.  Eyes: Negative.   Respiratory: Negative.   Cardiovascular: Negative.   Gastrointestinal: Positive for nausea and diarrhea. Negative for vomiting, abdominal pain, blood in stool and melena.  Genitourinary: Negative.   Musculoskeletal: Negative.   Skin: Negative.   Neurological: Positive for dizziness, speech change, focal weakness and weakness. Negative for seizures and loss of consciousness.  Endo/Heme/Allergies: Negative.   Psychiatric/Behavioral: Negative.     Blood pressure 172/82, pulse 71, temperature 98.2 F (36.8 C), temperature source Oral, resp. rate 18, height 5\' 5"  (1.651 m), weight 72.576 kg (160 lb), SpO2 100.00%. Physical Exam  Constitutional: She is oriented to person, place, and time. She appears well-developed and well-nourished.  HENT:  Head: Atraumatic.  Neck: Normal range of motion. Neck supple.  Cardiovascular: Normal rate, regular rhythm, normal heart sounds and intact distal pulses.  Exam reveals no gallop and no friction rub.   No murmur heard. Respiratory: Effort normal and breath sounds normal. No respiratory distress. She has no wheezes. She has no rales. She exhibits no tenderness.  GI: Soft. Bowel sounds are normal. She exhibits no distension and no mass. There is no tenderness. There is no rebound and no guarding.  Musculoskeletal: Normal range of motion.  Neurological: She is alert and oriented to person, place, and time. She has normal reflexes. Coordination abnormal.  Skin: Skin is warm.      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 Her neuro exam showed finger past pointing on the right side. The concern is for an acute cerebella stroke versus a TIA. It is possible her Gi symptoms are centrally related. However, although she has no evidence of acute gi bleeding, her anemia needs further evaluation sooner or later.  1.  Acute confusion/transient right sided weakness, TIA vs Acute cerebella nonhemorrhagic infarct- will admit patient to telemetry for stroke w/u. Also obtain EEG, PT consult. Switch patient to plavix as Jonne Ply seems ineffective. Continue statin, adequate BP control. Neurology consult in am ? Further  TIA/stroke workup. 2. Htn- will hold arb in view of renal insufficiency. Spread clonidine more evenly to avoid rebound effect. 3. Hyperlipidemia- on Lipitor. 4. Normocytic anemia- MCV on the low side. Anemia panel, stool hemoccult. Consider Gi, depending on w/u results. 5. DVT prophylaxis. 6. Patient's condition guarded.   Malijah Lietz 04/30/2011, 7:02 PM

## 2011-04-30 NOTE — Patient Instructions (Addendum)
F/u in 3 weeks.  You need to go to the ED for further evaluation of your new onset of disequilibrium and altered mental state. I have spoken to the Doc.

## 2011-04-30 NOTE — ED Notes (Signed)
Admission MD at bedside.  

## 2011-04-30 NOTE — ED Notes (Signed)
Pt to mri 

## 2011-04-30 NOTE — ED Notes (Signed)
No facial drooping noted. Right arm drift noted. Grips strong and equal. Symptoms began at 1130 am today. Pt had nausea/vomiting/diarrhea throughout night.

## 2011-04-30 NOTE — ED Notes (Signed)
Assisted pt with use of beside commode.pt was very confused. Pt had to be reminded to pull her pants down to urinate and reminded to use kleenex not her hand to wipe. Husband states she is not usually this way. RN made aware. Urine taken to the lab to be tested.

## 2011-04-30 NOTE — ED Provider Notes (Addendum)
History    the patient is a 65 year old female with a history of hypertension and hyperlipidemia.  She does not smoke.  She denies alcohol use. Her husband says last night.  She had vomiting and diarrhea.  He says today she became disoriented and her right arm seemed to be weak.  He says she was unable to walk because she will was falling to the side.  She has not had a cough.  Presently, she denies pain anywhere specifically she denies a headache, nausea Vomiting vision changes.  She denies shortness of breath.  She denies urinary tract symptoms, and she denies dizziness at this time.  She was going to see her doctor, Dr. Lodema Hong.  However, because of her ataxia.  Her husband brought her here.  CSN: [id number] Arrival date & time: 04/30/2011  2:06 PM   Chief Complaint  Patient presents with  . Nausea  . Emesis  . Diarrhea  . Fatigue  . Altered Mental Status     (Include location/radiation/quality/duration/timing/severity/associated sxs/prior treatment) Patient is a 65 y.o. female presenting with vomiting, diarrhea, and altered mental status. The history is provided by the spouse and the patient.  Emesis  Associated symptoms include diarrhea. Pertinent negatives include no abdominal pain, no cough, no fever and no headaches.  Diarrhea The primary symptoms include vomiting and diarrhea. Primary symptoms do not include fever, abdominal pain or dysuria.  The illness does not include back pain.  Altered Mental Status Pertinent negatives include no chest pain, no abdominal pain, no headaches and no shortness of breath.     Past Medical History  Diagnosis Date  . Hyperlipidemia   . Hypertension   . Allergy      Past Surgical History  Procedure Date  . Cataract extraction     right eye  . Tubal ligation     Family History  Problem Relation Age of Onset  . Cancer Mother     breast  . Hypertension Mother   . Diabetes Father   . Hypertension Father   . Diabetes Sister   .  Diabetes Brother   . Stroke Maternal Grandmother   . Hypertension Maternal Grandmother   . Diabetes Paternal Grandmother   . Cancer Brother     colon x 1  . Heart disease Brother     massive heart attack x 1  . Diabetes Brother     x1    History  Substance Use Topics  . Smoking status: Never Smoker   . Smokeless tobacco: Not on file  . Alcohol Use: No    OB History    Grav Para Term Preterm Abortions TAB SAB Ect Mult Living                  Review of Systems  Constitutional: Negative for fever.  HENT: Negative for congestion and neck pain.   Eyes: Negative for photophobia and visual disturbance.  Respiratory: Negative for cough, chest tightness and shortness of breath.   Cardiovascular: Negative for chest pain.  Gastrointestinal: Positive for vomiting and diarrhea. Negative for abdominal pain and abdominal distention.       Nausea and vomiting are now resolved  Genitourinary: Negative for dysuria and hematuria.  Musculoskeletal: Negative for back pain.  Neurological: Negative for weakness, light-headedness and headaches.  Psychiatric/Behavioral: Positive for confusion and altered mental status.       Her husband said that she seemed disoriented.  This morning, however, that has resolved    Allergies  Review of  patient's allergies indicates no active allergies.  Home Medications   Current Outpatient Rx  Name Route Sig Dispense Refill  . AMLODIPINE-OLMESARTAN 10-40 MG PO TABS Oral Take 1 tablet by mouth daily.      . ASPIRIN 81 MG PO TABS Oral Take 81 mg by mouth daily.      . ATORVASTATIN CALCIUM 80 MG PO TABS Oral Take 80 mg by mouth daily.      Marland Kitchen CALCIUM CARBONATE-VITAMIN D 500-200 MG-UNIT PO TABS Oral Take 1 tablet by mouth daily.      . CHOLINE FENOFIBRATE 135 MG PO CPDR Oral Take 135 mg by mouth daily.      Marland Kitchen CLONIDINE HCL 0.2 MG PO TABS Oral Take 0.2 mg by mouth daily.      Marland Kitchen LISINOPRIL-HYDROCHLOROTHIAZIDE 10-12.5 MG PO TABS Oral Take 1 tablet by mouth daily.       Marland Kitchen CLOTRIMAZOLE-BETAMETHASONE 1-0.05 % EX CREA Topical Apply topically 2 (two) times daily. 45 g 2    Physical Exam    BP 114/68  Pulse 86  Temp(Src) 98.2 F (36.8 C) (Oral)  Resp 20  Ht 5\' 5"  (1.651 m)  Wt 160 lb (72.576 kg)  BMI 26.63 kg/m2  SpO2 96%  Physical Exam  Constitutional: She is oriented to person, place, and time. She appears well-developed and well-nourished.  HENT:  Head: Normocephalic and atraumatic.  Eyes: Pupils are equal, round, and reactive to light.  Neck: Normal range of motion. No tracheal deviation present.  Cardiovascular: Normal rate, regular rhythm and normal heart sounds.   No murmur heard. Pulmonary/Chest: Effort normal and breath sounds normal. No respiratory distress. She has no wheezes. She has no rales.  Abdominal: Soft. She exhibits no distension and no mass. There is no tenderness. There is no rebound and no guarding.  Musculoskeletal: Normal range of motion. She exhibits no edema and no tenderness.  Neurological: She is alert and oriented to person, place, and time. No cranial nerve deficit. Coordination normal.       Romberg is negative.  She does not have a pronator drift.  Finger to nose is normal.  Speech is fluent and neurological.  There is no evidence of confusion or altered mental status now  Skin: Skin is warm and dry. No rash noted. No erythema.  Psychiatric: She has a normal mood and affect. Her behavior is normal.   ED ECG REPORT   Date: 04/30/2011  EKG Time:   Rate:70  Rhythm: normal sinus rhythm,    Axis:normal   Intervals:none  ST&T Change: twave inversions in antlat leads  Narrative Interpretation: Normal sinus rhythm with nonspecific T-wave changes          ED Course  Procedures Reported confusion and disorientation.  This morning with with ataxia.  All her symptoms have resolved now and her physical examination is normal. We will do a CT of her head and laboratory testing and monitor.  Her. There is no  indication for acute intervention or medical therapy now  5:17 PM I spoke with Dr. Lodema Hong.  She said she admits to the [facility].  Then, I spoke with Dr. Venetia Constable from the [facility].  He will see the patient in the emergency department and admit her  Results for orders placed during the hospital encounter of 04/30/11  COMPREHENSIVE METABOLIC PANEL      Component Value Range   Sodium 135  135 - 145 (mEq/L)   Potassium 3.6  3.5 - 5.1 (mEq/L)   Chloride  97  96 - 112 (mEq/L)   CO2 26  19 - 32 (mEq/L)   Glucose, Bld 157 (*) 70 - 99 (mg/dL)   BUN 25 (*) 6 - 23 (mg/dL)   Creatinine, Ser 1.61 (*) 0.50 - 1.10 (mg/dL)   Calcium 09.6  8.4 - 10.5 (mg/dL)   Total Protein 7.8  6.0 - 8.3 (g/dL)   Albumin 3.9  3.5 - 5.2 (g/dL)   AST 22  0 - 37 (U/L)   ALT 12  0 - 35 (U/L)   Alkaline Phosphatase 45  39 - 117 (U/L)   Total Bilirubin 0.5  0.3 - 1.2 (mg/dL)   GFR calc non Af Amer 36 (*) >60 (mL/min)   GFR calc Af Amer 43 (*) >60 (mL/min)  ETHANOL      Component Value Range   Alcohol, Ethyl (B) <11  0 - 11 (mg/dL)   Ct Head Wo Contrast  04/30/2011  *RADIOLOGY REPORT*  Clinical Data: Confusion, altered mental status, weakness, hypertension, nausea, vomiting, diarrhea  CT HEAD WITHOUT CONTRAST  Technique:  Contiguous axial images were obtained from the base of the skull through the vertex without contrast.  Comparison: None  Findings: Generalized atrophy. Normal ventricular morphology. No midline shift or mass effect. Small vessel chronic ischemic changes of deep cerebral white matter. Old appearing left thalamic lacunar infarct. Question artifact versus old brain stem infarct at medulla on image 6. No intracranial hemorrhage, mass lesion, or evidence of acute infarction. Visualized paranasal sinuses and mastoid air cells clear. Bones unremarkable.  IMPRESSION: Atrophy with small vessel chronic ischemic changes of deep cerebral white matter. Old lacunar infarct left thalamus and questionably of  brain stem. No acute intracranial abnormalities.  Original Report Authenticated By: Lollie Marrow, M.D.     No diagnosis found.   MDM Confusion       Nicholes Stairs, MD 04/30/11 1718  Nicholes Stairs, MD 04/30/11 1944

## 2011-05-01 ENCOUNTER — Inpatient Hospital Stay (HOSPITAL_COMMUNITY): Payer: Medicare Other

## 2011-05-01 ENCOUNTER — Ambulatory Visit (HOSPITAL_COMMUNITY)
Admit: 2011-05-01 | Discharge: 2011-05-01 | Disposition: A | Discharge status: Home / Self Care | Payer: Medicare Other | Attending: Internal Medicine | Admitting: Internal Medicine

## 2011-05-01 DIAGNOSIS — I517 Cardiomegaly: Secondary | ICD-10-CM

## 2011-05-01 DIAGNOSIS — E869 Volume depletion, unspecified: Secondary | ICD-10-CM | POA: Diagnosis present

## 2011-05-01 DIAGNOSIS — I639 Cerebral infarction, unspecified: Secondary | ICD-10-CM | POA: Diagnosis present

## 2011-05-01 DIAGNOSIS — N289 Disorder of kidney and ureter, unspecified: Secondary | ICD-10-CM | POA: Diagnosis present

## 2011-05-01 LAB — LIPID PANEL
HDL: 53 mg/dL (ref >39)
LDL Cholesterol: 107 mg/dL — ABNORMAL HIGH (ref 0–99)
Total CHOL/HDL Ratio: 3.3 RATIO
Triglycerides: 69 mg/dL (ref <150)

## 2011-05-01 LAB — CREATININE, URINE, RANDOM: Creatinine, Urine: 119.2 mg/dL

## 2011-05-01 LAB — PROTEIN, URINE, RANDOM: Total Protein, Urine: 21 mg/dL

## 2011-05-01 LAB — GLUCOSE, CAPILLARY: Glucose-Capillary: 116 mg/dL — ABNORMAL HIGH (ref 70–99)

## 2011-05-01 MED ORDER — SODIUM CHLORIDE 0.9 % IJ SOLN
INTRAMUSCULAR | Status: AC
  Administered 2011-05-01
  Filled 2011-05-01: qty 3

## 2011-05-01 MED ORDER — SODIUM CHLORIDE 0.9 % IJ SOLN
INTRAMUSCULAR | Status: AC
  Administered 2011-05-01: 15:00:00
  Filled 2011-05-01: qty 3

## 2011-05-01 NOTE — Plan of Care (Signed)
Problem: Discharge Progression Outcomes Goal: Activity appropriate for discharge plan Outcome: Completed/Met Date Met:  05/01/11 Patient independent.  Safe to d/c home with husband's assist and supervision.  No acute or f/u PT needed at this time.

## 2011-05-01 NOTE — Progress Notes (Signed)
Subjective: Patient feeling better this morning she states her right-sided weakness is resolved. She denies any chest pain, no shortness of breath. Objective: Vital signs in last 24 hours: Filed Vitals:   05/01/11 0625 05/01/11 0811 05/01/11 0830 05/01/11 0846  BP: 118/67 134/75 135/74 144/77  Pulse: 72 72  74  Temp: 98.9 F (37.2 C) 98.7 F (37.1 C)    TempSrc:  Oral    Resp: 20 16    Height:      Weight:      SpO2: 99% 99%  98%   Weight change:   Intake/Output Summary (Last 24 hours) at 05/01/11 1227 Last data filed at 05/01/11 0800  Gross per 24 hour  Intake 1266.46 ml  Output      0 ml  Net 1266.46 ml    General Appearance:    Alert, cooperative, no distress  Lungs:     Clear to auscultation bilaterally, respirations unlabored   Heart:    Regular rate and rhythm, S1 and S2 normal, no murmur, rub   or gallop  Abdomen:     Soft, non-tender, bowel sounds active all four quadrants,    no masses, no organomegaly  Extremities:   Extremities normal, atraumatic, no cyanosis or edema  Neurologic:   CNII-XII intact,  strength 5/5, sensation grossly intact.     Lab Results: Studies/Results: Ct Head Wo Contrast  IMPRESSION: Atrophy with small vessel chronic ischemic changes of deep cerebral white matter. 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 No acute intracranial abnormalities.  Original Report Authenticated By: Lollie Marrow, M.D.   Mr  Head Wo Contrast IMPRESSION:  Acute left MCA branch vessel distribution infarction affecting the  upper posterior temporal lobe, posterior insular region and in the  left frontoparietal region. The area of involvement measures about  4 cm in size. No hemorrhage.  Mri angiogram head Without Contrast  IMPRESSION: No correctable proximal anterior circulation pathology.  Missing branch vessels in the left MCA territory corresponding to the area of acute infarction.  Diminutive posterior circulation.  No antegrade  flow is seen within the distal left vertebral artery. Small and diseased left vertebral artery patent to the basilar with mid basilar stenosis.  Both posterior cerebral arteries probably take a fetal origin from the anterior circulation.  Original Report Authenticated By: Thomasenia Sales, M.D.   Dg Chest Portable 1 View  04/30/2011 IMPRESSION: No evidence of acute cardiopulmonary disease.   Scheduled Meds:   . amLODipine  10 mg Oral Daily  . calcium-vitamin D  1 tablet Oral Daily  . cloNIDine  0.1 mg Oral TID  . clopidogrel  75 mg Oral Q breakfast  . enoxaparin  30 mg Subcutaneous Q24H  . fenofibrate  160 mg Oral Daily  . influenza  inactive virus vaccine  0.5 mL Intramuscular Once  . pantoprazole  40 mg Oral Q1200  . potassium chloride  40 mEq Oral Once  . rosuvastatin  40 mg Oral q1800  . sodium chloride      . DISCONTD: simvastatin  40 mg Oral Daily   Continuous Infusions:   . sodium chloride 125 mL/hr at 05/01/11 0810   PRN Meds:.acetaminophen  Principal Problem:  *Acute cerebral infarction - left MCA branch vessel distribution, affecting the upper posterior temporal lobe, posterior insular region and into the left fronto parietal region. Well obtain carotid Doppler ultrasound. Await the 2-D echo and EEG ordered on admission. Will continue Plavix, I also await neurology evaluation. Active Problems:  HYPERTENSION- will continue on Norvasc and clonidine.  HYPERLIPIDEMIA- will continue Crestor.  Acute renal insufficiency - will hydrate follow and recheck.  Volume depletion, unspecified- hydrating as above.    LOS: 1 day   Lindsay Robertson C 05/01/2011, 12:27 PM

## 2011-05-01 NOTE — Plan of Care (Signed)
Problem: Phase II Progression Outcomes Goal: Progress activity as tolerated unless otherwise ordered Outcome: Adequate for Discharge PT evaluation complete.  No acute or f/u PT needed at this time.  Encouraged patient to walk with husband's supervision 3x/day while staying here in the hospital.    Problem: Phase III Progression Outcomes Goal: Activity at appropriate level-compared to baseline (UP IN CHAIR FOR HEMODIALYSIS)  Outcome: Completed/Met Date Met:  05/01/11 Independent mobility, equal strength bilaterally

## 2011-05-01 NOTE — Progress Notes (Signed)
Physical Therapy Evaluation Patient Name: Lindsay Robertson Date: 05/01/2011 Problem List:  Patient Active Problem List  Diagnoses  . TINEA CAPITIS  . HYPERLIPIDEMIA  . DEHYDRATION  . BLURRED VISION, INTERMITTENT  . HYPERTENSION  . GASTROENTERITIS  . ARM PAIN  . FATIGUE  . CAROTID BRUITS, BILATERAL  . IMPAIRED FASTING GLUCOSE  . ALLERGIC RHINITIS CAUSE UNSPECIFIED  . Pelvic pain in female  . TIA (transient ischemic attack)   Past Medical History:  Past Medical History  Diagnosis Date  . Hyperlipidemia   . Hypertension   . Allergy    Past Surgical History:  Past Surgical History  Procedure Date  . Cataract extraction     right eye  . Tubal ligation     Precautions/Restrictions   none Prior Functioning  Home Living Type of Home: House Lives With: Spouse Receives Help From: Family Home Layout: Multi-level Alternate Level Stairs-Rails: Right Alternate Level Stairs-Number of Steps: 5 Home Access: Stairs to enter Entrance Stairs-Rails: None Entrance Stairs-Number of Steps: 1 Bathroom Shower/Tub: Tub/shower unit;Walk-in shower Bathroom Toilet: Standard Home Adaptive Equipment: Walker - rolling Prior Function Level of Independence: Independent with basic ADLs;Independent with homemaking with ambulation Cognition Cognition Orientation Level: Oriented to person;Oriented to place;Oriented to situation;Oriented to time Sensation/Coordination Coordination Gross Motor Movements are Fluid and Coordinated: Yes Fine Motor Movements are Fluid and Coordinated: No Coordination and Movement Description: decreased finger to nose coordination on right compared to left.   Extremity Assessment RUE Assessment RUE Assessment: Within Functional Limits (equal bilaterally) LUE Assessment LUE Assessment: Within Functional Limits RLE Assessment RLE Assessment: Within Functional Limits LLE Assessment LLE Assessment: Within Functional Limits Mobility (including Balance) Bed  Mobility Bed Mobility: Yes Supine to Sit: 6: Modified independent (Device/Increase time) (HOB 30 degrees and patient pulling on bedrail.) Sit to Supine - Right: 6: Modified independent (Device/Increase time) (HOB flat and patient pulling on bedrail.  ) Transfers Transfers: Yes Sit to Stand: 7: Independent Stand to Sit: 7: Independent Ambulation/Gait Ambulation/Gait: Yes Ambulation/Gait Assistance: 5: Supervision Ambulation/Gait Assistance Details (indicate cue type and reason): patient walks with R foot externally rotated.  No LOB during gait.   Ambulation Distance (Feet): 300 Feet Assistive device: None Gait Pattern: Within Functional Limits (gait is at patient's baseline.)  Balance Balance Assessed: Yes Dynamic Standing Balance Dynamic Standing - Level of Assistance: 5: Stand by assistance Dynamic Standing - Comments: patient able to perfrom 360 degree turn in less than 4 seconds bilaterally, can pick up object from floor safely and easily.   Exercise   none performed- encouraged patient to walk hall with husband's supervision 3x/day while she is in the hospital.    End of Session PT - End of Session Equipment Utilized During Treatment: Gait belt Activity Tolerance: Patient tolerated treatment well Patient left: in bed;with family/visitor present (husband present) Nurse Communication: Mobility status for ambulation;Other (comment) (walk in halls 3x/day with husband.  ) General Behavior During Session: [facility] for tasks performed Cognition: Impaired Cognitive Impairment: decreased STM.  Husband reports she can't recall her birthday.  she is able to tell me the month, but not the actual day of her birthday.  Husband states that her speech has been the way it is for "over 30 years" "ever since I have known her" PT Assessment/Plan/Recommendation PT Assessment Clinical Impression Statement: 65 y.o. female who presents with AMS and + L temporal lobe CVA.  She is at her mobility baseline  and has an apporpriate level of help from her husband at home.  No acute or f/u Pt needed at this time.  PT to sign off.   PT Recommendation/Assessment: Patent does not need any further PT services No Skilled PT: All education completed;Patient at baseline level of functioning;Patient will have necessary level of assist by caregiver at discharge;Patient is independent with all acitivity/mobility;Patient is modified independent with all activity/mobility;Patient is supervision for all activity/mobility PT Goals   NA Darron Stuck, Claretta Fraise 05/01/2011, 11:27 AM

## 2011-05-01 NOTE — Progress Notes (Signed)
*  PRELIMINARY RESULTS* Echocardiogram 2D Echocardiogram has been performed.  Conrad [place] 05/01/2011, 1:50 PM

## 2011-05-02 LAB — GLUCOSE, CAPILLARY
Glucose-Capillary: 118 mg/dL — ABNORMAL HIGH (ref 70–99)
Glucose-Capillary: 102 mg/dL — ABNORMAL HIGH (ref 70–99)
Glucose-Capillary: 112 mg/dL — ABNORMAL HIGH (ref 70–99)
Glucose-Capillary: 134 mg/dL — ABNORMAL HIGH (ref 70–99)

## 2011-05-02 LAB — COMPREHENSIVE METABOLIC PANEL
ALT: 11 U/L (ref 0–35)
AST: 31 U/L (ref 0–37)
Albumin: 3.6 g/dL (ref 3.5–5.2)
Alkaline Phosphatase: 55 U/L (ref 39–117)
BUN: 16 mg/dL (ref 6–23)
CO2: 22 mEq/L (ref 19–32)
Calcium: 9.5 mg/dL (ref 8.4–10.5)
Chloride: 101 mEq/L (ref 96–112)
Creatinine, Ser: 1.09 mg/dL (ref 0.50–1.10)
GFR calc Af Amer: >60 mL/min (ref >60)
GFR calc non Af Amer: 50 mL/min — ABNORMAL LOW (ref >60)
Glucose, Bld: 105 mg/dL — ABNORMAL HIGH (ref 70–99)
Potassium: 3.5 mEq/L (ref 3.5–5.1)
Sodium: 134 mEq/L — ABNORMAL LOW (ref 135–145)
Total Bilirubin: 0.3 mg/dL (ref 0.3–1.2)
Total Protein: 7.4 g/dL (ref 6.0–8.3)

## 2011-05-02 LAB — DIFFERENTIAL
Basophils Absolute: 0 10*3/uL (ref 0.0–0.1)
Basophils Relative: 0 % (ref 0–1)
Eosinophils Absolute: 0.1 10*3/uL (ref 0.0–0.7)
Eosinophils Relative: 2 % (ref 0–5)
Lymphocytes Relative: 40 % (ref 12–46)
Lymphs Abs: 2.3 10*3/uL (ref 0.7–4.0)
Monocytes Absolute: 0.9 10*3/uL (ref 0.1–1.0)
Monocytes Relative: 15 % — ABNORMAL HIGH (ref 3–12)
Neutro Abs: 2.4 10*3/uL (ref 1.7–7.7)
Neutrophils Relative %: 42 % — ABNORMAL LOW (ref 43–77)

## 2011-05-02 LAB — CBC
HCT: 30.5 % — ABNORMAL LOW (ref 36.0–46.0)
Hemoglobin: 10.5 g/dL — ABNORMAL LOW (ref 12.0–15.0)
MCH: 28.1 pg (ref 26.0–34.0)
MCHC: 34.4 g/dL (ref 30.0–36.0)

## 2011-05-02 NOTE — Procedures (Signed)
Lindsay Robertson, BOMBA               ACCOUNT NO.:  [id number]  MEDICAL RECORD NO.:  [id number]  LOCATION:  EE                           FACILITY:  [facility]  PHYSICIAN:  Bricelyn Freestone A. Gerilyn Pilgrim, M.D. DATE OF BIRTH:  [birth year]  DATE OF PROCEDURE:  05/01/2011 DATE OF DISCHARGE:  05/01/2011                             EEG INTERPRETATION   REFERRING PHYSICIAN:  [facility].  INDICATION:  A 65 year old who presents with altered mentation.  Study done to evaluate for seizures.  MEDICATIONS:  Aspirin, Lipitor, Catapres, Azor, Zestoretic, and fenofibrate.  ANALYSIS:  This is a 16-channel recording using standard 10/20 measurements is conducted for 20 minutes.  There is a well-formed posterior dominant rhythm of 10 Hz with attenuates with eye opening. Only awake activities are recorded.  Photic stimulation and hypoventilation are carried out without abnormal changes in the background activity.  There is no focal or lateralized slowing.  There is no epileptiform activity observed.  IMPRESSION:  Normal recording of the awake state.     Lindsay Robertson A. Gerilyn Pilgrim, M.D.     KAD/MEDQ  D:  05/02/2011  T:  05/02/2011  Job:  [id number]

## 2011-05-02 NOTE — Consult Note (Signed)
Reason for Consult:cva Referring Physician: hospitalist  Lindsay Robertson is an 65 y.o. female.  HPI:   The patient is a 65 year old black female who presents with the acute onset of confusion and right-sided weakness. There are some reports that she did have some transient nausea vomiting although this has resolved. Symptoms presented acutely on the morning and question. She did contact her primary care provider who urge [facility] to [facility] soon as possible. The patient reports that her right-sided weakness has improved significantly. He denied any speaking problems although she is to have subtle evidence of aphasia physical examination. Her husband reports that her speech is at baseline.  Past Medical History  Diagnosis Date  . Hyperlipidemia   . Hypertension   . Allergy     Past Surgical History  Procedure Date  . Cataract extraction     right eye  . Tubal ligation     Family History  Problem Relation Age of Onset  . Cancer Mother     breast  . Hypertension Mother   . Diabetes Father   . Hypertension Father   . Diabetes Sister   . Diabetes Brother   . Stroke Maternal Grandmother   . Hypertension Maternal Grandmother   . Diabetes Paternal Grandmother   . Cancer Brother     colon x 1  . Heart disease Brother     massive heart attack x 1  . Diabetes Brother     x1    Social History:  reports that she has never smoked. She does not have any smokeless tobacco history on file. She reports that she does not drink alcohol or use illicit drugs.  Allergies: No Known Allergies  Medications:.hmefs   Scheduled Meds:   . amLODipine  10 mg Oral Daily  . calcium-vitamin D  1 tablet Oral Daily  . cloNIDine  0.1 mg Oral TID  . clopidogrel  75 mg Oral Q breakfast  . enoxaparin  30 mg Subcutaneous Q24H  . fenofibrate  160 mg Oral Daily  . pantoprazole  40 mg Oral Q1200  . rosuvastatin  40 mg Oral q1800  . sodium chloride       Continuous Infusions:   . sodium  chloride 125 mL/hr at 05/02/11 1030   PRN Meds:.acetaminophen   Results for orders placed during the hospital encounter of 04/30/11 (from the past 48 hour(s))  CBC     Status: Abnormal   Collection Time   04/30/11  2:55 PM      Component Value Range Comment   WBC 7.0  4.0 - 10.5 (K/uL)    RBC 3.98  3.87 - 5.11 (MIL/uL)    Hemoglobin 11.0 (*) 12.0 - 15.0 (g/dL)    HCT 16.1 (*) 09.6 - 46.0 (%)    MCV 81.7  78.0 - 100.0 (fL)    MCH 27.6  26.0 - 34.0 (pg)    MCHC 33.8  30.0 - 36.0 (g/dL)    RDW 04.5 (*) 40.9 - 15.5 (%)    Platelets 250  150 - 400 (K/uL)   DIFFERENTIAL     Status: Abnormal   Collection Time   04/30/11  2:55 PM      Component Value Range Comment   Neutrophils Relative 57  43 - 77 (%)    Neutro Abs 4.0  1.7 - 7.7 (K/uL)    Lymphocytes Relative 27  12 - 46 (%)    Lymphs Abs 1.9  0.7 - 4.0 (K/uL)  Monocytes Relative 14 (*) 3 - 12 (%)    Monocytes Absolute 1.0  0.1 - 1.0 (K/uL)    Eosinophils Relative 1  0 - 5 (%)    Eosinophils Absolute 0.1  0.0 - 0.7 (K/uL)    Basophils Relative 0  0 - 1 (%)    Basophils Absolute 0.0  0.0 - 0.1 (K/uL)   COMPREHENSIVE METABOLIC PANEL     Status: Abnormal   Collection Time   04/30/11  2:55 PM      Component Value Range Comment   Sodium 135  135 - 145 (mEq/L)    Potassium 3.6  3.5 - 5.1 (mEq/L)    Chloride 97  96 - 112 (mEq/L)    CO2 26  19 - 32 (mEq/L)    Glucose, Bld 157 (*) 70 - 99 (mg/dL)    BUN 25 (*) 6 - 23 (mg/dL)    Creatinine, Ser 9.56 (*) 0.50 - 1.10 (mg/dL)    Calcium 21.3  8.4 - 10.5 (mg/dL)    Total Protein 7.8  6.0 - 8.3 (g/dL)    Albumin 3.9  3.5 - 5.2 (g/dL)    AST 22  0 - 37 (U/L)    ALT 12  0 - 35 (U/L)    Alkaline Phosphatase 45  39 - 117 (U/L)    Total Bilirubin 0.5  0.3 - 1.2 (mg/dL)    GFR calc non Af Amer 36 (*) >60 (mL/min)    GFR calc Af Amer 43 (*) >60 (mL/min)   ETHANOL     Status: Normal   Collection Time   04/30/11  2:55 PM      Component Value Range Comment   Alcohol, Ethyl (B) <11  0 - 11  (mg/dL)   AMMONIA     Status: Abnormal   Collection Time   04/30/11  2:55 PM      Component Value Range Comment   Ammonia <10 (*) 11 - 60 (umol/L)   URINE RAPID DRUG SCREEN (HOSP PERFORMED)     Status: Normal   Collection Time   04/30/11  3:38 PM      Component Value Range Comment   Opiates NONE DETECTED  NONE DETECTED     Cocaine NONE DETECTED  NONE DETECTED     Benzodiazepines NONE DETECTED  NONE DETECTED     Amphetamines NONE DETECTED  NONE DETECTED     Tetrahydrocannabinol NONE DETECTED  NONE DETECTED     Barbiturates NONE DETECTED  NONE DETECTED    URINALYSIS, ROUTINE W REFLEX MICROSCOPIC     Status: Abnormal   Collection Time   04/30/11  3:38 PM      Component Value Range Comment   Color, Urine YELLOW  YELLOW     Appearance CLEAR  CLEAR     Specific Gravity, Urine 1.025  1.005 - 1.030     pH 5.5  5.0 - 8.0     Glucose, UA NEGATIVE  NEGATIVE (mg/dL)    Hgb urine dipstick NEGATIVE  NEGATIVE     Bilirubin Urine NEGATIVE  NEGATIVE     Ketones, ur NEGATIVE  NEGATIVE (mg/dL)    Protein, ur 30 (*) NEGATIVE (mg/dL)    Urobilinogen, UA 0.2  0.0 - 1.0 (mg/dL)    Nitrite NEGATIVE  NEGATIVE     Leukocytes, UA NEGATIVE  NEGATIVE    URINE MICROSCOPIC-ADD ON     Status: Abnormal   Collection Time   04/30/11  3:38 PM      Component  Value Range Comment   Squamous Epithelial / LPF FEW (*) RARE     WBC, UA 0-2  <3 (WBC/hpf)    Bacteria, UA FEW (*) RARE     Casts HYALINE CASTS (*) NEGATIVE    HEMOGLOBIN A1C     Status: Normal   Collection Time   04/30/11  6:36 PM      Component Value Range Comment   Hemoglobin A1C 5.6  <5.7 (%)    Mean Plasma Glucose 114  <117 (mg/dL)   VITAMIN W11     Status: Normal   Collection Time   04/30/11  6:36 PM      Component Value Range Comment   Vitamin B-12 311  211 - 911 (pg/mL)   FOLATE     Status: Normal   Collection Time   04/30/11  6:36 PM      Component Value Range Comment   Folate 5.7     IRON AND TIBC     Status: Normal   Collection Time    04/30/11  6:36 PM      Component Value Range Comment   Iron 83  42 - 135 (ug/dL)    TIBC 914  782 - 956 (ug/dL)    Saturation Ratios 24  20 - 55 (%)    UIBC 266  125 - 400 (ug/dL)   FERRITIN     Status: Abnormal   Collection Time   04/30/11  6:36 PM      Component Value Range Comment   Ferritin 400 (*) 10 - 291 (ng/mL)   RETICULOCYTES     Status: Normal   Collection Time   04/30/11  6:36 PM      Component Value Range Comment   Retic Ct Pct 1.4  0.4 - 3.1 (%)    RBC. 3.98  3.87 - 5.11 (MIL/uL)    Retic Count, Manual 55.7  19.0 - 186.0 (K/uL)   CARDIAC PANEL(CRET KIN+CKTOT+MB+TROPI)     Status: Abnormal   Collection Time   04/30/11  6:39 PM      Component Value Range Comment   Total CK 563 (*) 7 - 177 (U/L)    CK, MB 4.3 (*) 0.3 - 4.0 (ng/mL)    Troponin I <0.30  <0.30 (ng/mL)    Relative Index 0.8  0.0 - 2.5    GLUCOSE, CAPILLARY     Status: Normal   Collection Time   04/30/11 11:33 PM      Component Value Range Comment   Glucose-Capillary 96  70 - 99 (mg/dL)   LIPID PANEL     Status: Abnormal   Collection Time   05/01/11  4:40 AM      Component Value Range Comment   Cholesterol 174  0 - 200 (mg/dL)    Triglycerides 69  <213 (mg/dL)    HDL 53  >08 (mg/dL)    Total CHOL/HDL Ratio 3.3      VLDL 14  0 - 40 (mg/dL)    LDL Cholesterol 657 (*) 0 - 99 (mg/dL)   RPR     Status: Normal   Collection Time   05/01/11  4:40 AM      Component Value Range Comment   RPR NON REACTIVE  NON REACTIVE    GLUCOSE, CAPILLARY     Status: Normal   Collection Time   05/01/11  7:11 AM      Component Value Range Comment   Glucose-Capillary 92  70 - 99 (mg/dL)    Comment  1 Notify RN      Comment 2 Documented in Chart     GLUCOSE, CAPILLARY     Status: Abnormal   Collection Time   05/01/11 11:30 AM      Component Value Range Comment   Glucose-Capillary 116 (*) 70 - 99 (mg/dL)    Comment 1 Notify RN      Comment 2 Documented in Chart     SODIUM, URINE, RANDOM     Status: Normal   Collection Time     05/01/11  4:46 PM      Component Value Range Comment   Sodium, Ur 108     CREATININE, URINE, RANDOM     Status: Normal   Collection Time   05/01/11  4:46 PM      Component Value Range Comment   Creatinine, Urine 119.2     PROTEIN, URINE, RANDOM     Status: Normal   Collection Time   05/01/11  4:46 PM      Component Value Range Comment   Total Protein, Urine 21   NO NORMAL RANGE ESTABLISHED FOR THIS TEST  COMPREHENSIVE METABOLIC PANEL     Status: Abnormal   Collection Time   05/02/11  4:47 AM      Component Value Range Comment   Sodium 134 (*) 135 - 145 (mEq/L)    Potassium 3.5  3.5 - 5.1 (mEq/L)    Chloride 101  96 - 112 (mEq/L)    CO2 22  19 - 32 (mEq/L)    Glucose, Bld 105 (*) 70 - 99 (mg/dL)    BUN 16  6 - 23 (mg/dL)    Creatinine, Ser 1.61  0.50 - 1.10 (mg/dL)    Calcium 9.5  8.4 - 10.5 (mg/dL)    Total Protein 7.4  6.0 - 8.3 (g/dL)    Albumin 3.6  3.5 - 5.2 (g/dL)    AST 31  0 - 37 (U/L)    ALT 11  0 - 35 (U/L)    Alkaline Phosphatase 55  39 - 117 (U/L)    Total Bilirubin 0.3  0.3 - 1.2 (mg/dL)    GFR calc non Af Amer 50 (*) >60 (mL/min)    GFR calc Af Amer >60  >60 (mL/min)   CBC     Status: Abnormal   Collection Time   05/02/11  4:47 AM      Component Value Range Comment   WBC 5.7  4.0 - 10.5 (K/uL)    RBC 3.74 (*) 3.87 - 5.11 (MIL/uL)    Hemoglobin 10.5 (*) 12.0 - 15.0 (g/dL)    HCT 09.6 (*) 04.5 - 46.0 (%)    MCV 81.6  78.0 - 100.0 (fL)    MCH 28.1  26.0 - 34.0 (pg)    MCHC 34.4  30.0 - 36.0 (g/dL)    RDW 40.9 (*) 81.1 - 15.5 (%)    Platelets 210  150 - 400 (K/uL)   DIFFERENTIAL     Status: Abnormal   Collection Time   05/02/11  4:47 AM      Component Value Range Comment   Neutrophils Relative 42 (*) 43 - 77 (%)    Neutro Abs 2.4  1.7 - 7.7 (K/uL)    Lymphocytes Relative 40  12 - 46 (%)    Lymphs Abs 2.3  0.7 - 4.0 (K/uL)    Monocytes Relative 15 (*) 3 - 12 (%)    Monocytes Absolute 0.9  0.1 - 1.0 (K/uL)  Eosinophils Relative 2  0 - 5 (%)     Eosinophils Absolute 0.1  0.0 - 0.7 (K/uL)    Basophils Relative 0  0 - 1 (%)    Basophils Absolute 0.0  0.0 - 0.1 (K/uL)     Ct Head Wo Contrast  04/30/2011  *RADIOLOGY REPORT*  Clinical Data: Confusion, altered mental status, weakness, hypertension, nausea, vomiting, diarrhea  CT HEAD WITHOUT CONTRAST  Technique:  Contiguous axial images were obtained from the base of the skull through the vertex without contrast.  Comparison: None  Findings: Generalized atrophy. Normal ventricular morphology. No midline shift or mass effect. Small vessel chronic ischemic changes of deep cerebral white matter. Old appearing left thalamic lacunar infarct. Question artifact versus old brain stem infarct at medulla on image 6. No intracranial hemorrhage, mass lesion, or evidence of acute infarction. Visualized paranasal sinuses and mastoid air cells clear. Bones unremarkable.  IMPRESSION: Atrophy with small vessel chronic ischemic changes of deep cerebral white matter. 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 No acute intracranial abnormalities.  Original Report Authenticated By: Lollie Marrow, M.D.   Mr Angiogram Head Wo Contrast  05/01/2011  *RADIOLOGY REPORT*  Clinical Data:  TIA.  Altered mental status.  Weakness.  MRI HEAD WITHOUT CONTRAST MRA HEAD WITHOUT CONTRAST  Technique:  Multiplanar, multiecho pulse sequences of the brain and surrounding structures were obtained without intravenous contrast. Angiographic images of the head were obtained using MRA technique without contrast.  Comparison:  Head CT same day  MRI HEAD  Findings:  There is acute infarction affecting the left upper temporal lobe, upper posterior insula and frontoparietal region of the.  The area involved measures about 4 cm in size.  There is no brain swelling at this time.  There is no hemorrhage or mass effect.  The brainstem and cerebellum show a few old small vessel infarctions.  The cerebral hemispheres show an old lacunar  infarction in the left thalamus and extensive chronic small vessel disease throughout the deep and subcortical white matter.  No mass lesion, hydrocephalus or extra-axial collection.  No pituitary mass sinuses, middle ears and mastoids are clear.  No skull or skull base lesion.  IMPRESSION: Acute left MCA branch vessel distribution infarction affecting the upper posterior temporal lobe, posterior insular region and in the left frontoparietal region.  The area of involvement measures about 4 cm in size.  No hemorrhage.  Extensive chronic small vessel infarctions elsewhere throughout the brain.  MRA HEAD  Findings: Both internal carotid arteries are widely patent into the brain.  There is some atherosclerotic irregularity in the siphon regions but no suspicion of flow-limiting stenosis.  The anterior and middle cerebral vessels are patent proximally.  No proximal MCA stenosis.  The A1 segment on the right is diminutive.  This could be congenital or due to atherosclerotic narrowing.  At there are a few missing branch vessels in the left middle cerebral artery territory consistent with the region of acute infarction.  The posterior circulation is diminutive.  No antegrade flow is seen in the right vertebral artery.  Left vertebral artery is a small vessel patent to a small basilar artery.  Basilar artery shows areas of atherosclerotic narrowing.  Basilar artery gives supply to the superior cerebellar arteries.  Both posterior cerebral artery is seen to arise from the anterior circulation.  IMPRESSION: No correctable proximal anterior circulation pathology.  Missing branch vessels in the left MCA territory corresponding to the area of acute infarction.  Diminutive posterior circulation.  No antegrade flow is seen within the distal left vertebral artery. Small and diseased left vertebral artery patent to the basilar with mid basilar stenosis.  Both posterior cerebral arteries probably take a fetal origin from the anterior  circulation.  Original Report Authenticated By: Thomasenia Sales, M.D.   Mri Brain Without Contrast  05/01/2011  *RADIOLOGY REPORT*  Clinical Data:  TIA.  Altered mental status.  Weakness.  MRI HEAD WITHOUT CONTRAST MRA HEAD WITHOUT CONTRAST  Technique:  Multiplanar, multiecho pulse sequences of the brain and surrounding structures were obtained without intravenous contrast. Angiographic images of the head were obtained using MRA technique without contrast.  Comparison:  Head CT same day  MRI HEAD  Findings:  There is acute infarction affecting the left upper temporal lobe, upper posterior insula and frontoparietal region of the.  The area involved measures about 4 cm in size.  There is no brain swelling at this time.  There is no hemorrhage or mass effect.  The brainstem and cerebellum show a few old small vessel infarctions.  The cerebral hemispheres show an old lacunar infarction in the left thalamus and extensive chronic small vessel disease throughout the deep and subcortical white matter.  No mass lesion, hydrocephalus or extra-axial collection.  No pituitary mass sinuses, middle ears and mastoids are clear.  No skull or skull base lesion.  IMPRESSION: Acute left MCA branch vessel distribution infarction affecting the upper posterior temporal lobe, posterior insular region and in the left frontoparietal region.  The area of involvement measures about 4 cm in size.  No hemorrhage.  Extensive chronic small vessel infarctions elsewhere throughout the brain.  MRA HEAD  Findings: Both internal carotid arteries are widely patent into the brain.  There is some atherosclerotic irregularity in the siphon regions but no suspicion of flow-limiting stenosis.  The anterior and middle cerebral vessels are patent proximally.  No proximal MCA stenosis.  The A1 segment on the right is diminutive.  This could be congenital or due to atherosclerotic narrowing.  At there are a few missing branch vessels in the left middle  cerebral artery territory consistent with the region of acute infarction.  The posterior circulation is diminutive.  No antegrade flow is seen in the right vertebral artery.  Left vertebral artery is a small vessel patent to a small basilar artery.  Basilar artery shows areas of atherosclerotic narrowing.  Basilar artery gives supply to the superior cerebellar arteries.  Both posterior cerebral artery is seen to arise from the anterior circulation.  IMPRESSION: No correctable proximal anterior circulation pathology.  Missing branch vessels in the left MCA territory corresponding to the area of acute infarction.  Diminutive posterior circulation.  No antegrade flow is seen within the distal left vertebral artery. Small and diseased left vertebral artery patent to the basilar with mid basilar stenosis.  Both posterior cerebral arteries probably take a fetal origin from the anterior circulation.  Original Report Authenticated By: Thomasenia Sales, M.D.   US Carotid Duplex Bilateral  05/01/2011  *RADIOLOGY REPORT*  Clinical Data: Stroke, hypertension  BILATERAL CAROTID DUPLEX ULTRASOUND  Technique: Wallace Cullens scale imaging, color Doppler and duplex ultrasound was performed of bilateral carotid and vertebral arteries in the neck.  Comparison:  04/12/2010  Criteria:  Quantification of carotid stenosis is based on velocity parameters that correlate the residual internal carotid diameter with NASCET-based stenosis levels, using the diameter of the distal internal carotid lumen as the denominator for stenosis measurement.  The following velocity  measurements were obtained:                   PEAK SYSTOLIC/END DIASTOLIC RIGHT ICA:                        82/18cm/sec CCA:                        96/14cm/sec SYSTOLIC ICA/CCA RATIO:     0.85 DIASTOLIC ICA/CCA RATIO:    1.33 ECA:                        104cm/sec  LEFT ICA:                        108/22cm/sec CCA:                        99/16cm/sec SYSTOLIC ICA/CCA RATIO:     1.09  DIASTOLIC ICA/CCA RATIO:    1.35 ECA:                        57cm/sec  Findings:  RIGHT CAROTID ARTERY: Minimally tortuous right carotid system. Intimal thickening right CCA.  Plaque identified at distal right CCA end bulb, questionably showing a central ulceration.  Turbulent flow left carotid bifurcation on color Doppler imaging as well as within proximal right ICA.  Spectral broadening in right ICA on waveform analysis.  No high velocity jets.  RIGHT VERTEBRAL ARTERY:  Patent, antegrade  LEFT CAROTID ARTERY: Tortuous left carotid system.  Intimal thickening left CCA.  High bifurcation.  Mildly turbulent flow at left ICA on color Doppler imaging likely due to tortuosity, with associated spectral broadening. Minimal plaque identified. No high velocity jets.  LEFT VERTEBRAL ARTERY:  Patent, antegrade  IMPRESSION: Tortuous systems carotid systems bilaterally.  Mild plaque formation at the right carotid bulb and distal right common carotid artery with questionably a small ulcerated plaque; recommend CTA imaging of the neck with contrast to evaluate. No evidence of hemodynamically significant stenosis by velocity measurements.  Original Report Authenticated By: Lollie Marrow, M.D.   Dg Chest Portable 1 View  04/30/2011  *RADIOLOGY REPORT*  Clinical Data: TIA  PORTABLE CHEST - 1 VIEW  Comparison: 01/26/2007  Findings: Lungs are clear. No pleural effusion or pneumothorax.  The heart is top normal in size.  IMPRESSION: No evidence of acute cardiopulmonary disease.  Original Report Authenticated By: Charline Bills, M.D.    Review of Systems  Constitutional: Negative.   HENT: Negative.   Eyes: Negative.   Respiratory: Negative.   Cardiovascular: Negative.   Gastrointestinal: Positive for nausea and vomiting.  Genitourinary: Negative.   Musculoskeletal: Negative.   Skin: Negative.   Endo/Heme/Allergies: Negative.   Psychiatric/Behavioral: Negative.    Blood pressure 130/70, pulse 68, temperature 97.6 F  (36.4 C), temperature source Oral, resp. rate 16, height 5\' 5"  (1.651 m), weight 71.9 kg (158 lb 8.2 oz), SpO2 97.00%. Physical Exam  GENERAL: Pleasant lady in no acute distress.  HEENT: Supple. Atraumatic normocephalic.   ABDOMEN: soft  EXTREMITIES: No edema   BACK: Normal.  SKIN: Normal by inspection.    MENTAL STATUS: Alert and oriented. Speech and cognition are generally intact. Judgment and insight normal. She has mild to difficulties with comprehension and moderate naming impairment. She had difficulties naming 4/6 objects tested. That is, she only got 2 correct.  CRANIAL NERVES: Pupils are equal, round and reactive to light and accomodation; extra ocular movements are full, there is no significant nystagmus; visual fields are full; upper and lower facial muscles are normal in strength and symmetric, there is no flattening of the nasolabial folds; tongue is midline; uvula is midline; shoulder elevation is normal.  MOTOR: Normal tone, bulk and strength; no pronator drift.  COORDINATION: Left finger to nose is normal, right finger to nose is normal, No rest tremor; no intention tremor; no postural tremor; no bradykinesia.  REFLEXES: Deep tendon reflexes are symmetrical and normal. Babinski reflexes are extensor bilaterally.    SENSATION: Normal to light touch.  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 Risk factors hypertension and hyperglycemia. She also seemed to have extensive white matter ischemic changes chronic and old lacunar infarcts. She has been switched from aspirin to Plavix. She will need to have blood pressure control for further risk reduction. Her blood sugar also need to be monitored.  Lee-Ann Gal 05/02/2011, 8:16 AM

## 2011-05-02 NOTE — Progress Notes (Signed)
UR Chart Review Completed  

## 2011-05-02 NOTE — Progress Notes (Signed)
Subjective: Still with difficulty expressing herself, denies any new complaints.    Objective: Vital signs in last 24 hours: Filed Vitals:   05/01/11 2004 05/01/11 2145 05/02/11 0157 05/02/11 0606  BP:  153/80 149/95 130/70  Pulse:  72 66 68  Temp:  98.6 F (37 Robertson) 97.5 F (36.4 Robertson) 97.6 F (36.4 Robertson)  TempSrc:      Resp:  20 16 16   Height:      Weight:      SpO2: 97% 95% 99% 97%   Weight change:   Intake/Output Summary (Last 24 hours) at 05/02/11 1049 Last data filed at 05/02/11 0900  Gross per 24 hour  Intake 3809.17 ml  Output    500 ml  Net 3309.17 ml    General Appearance:    Alert, cooperative, no distress,   Lungs:     Clear to auscultation bilaterally, respirations unlabored   Heart:    Regular rate and rhythm, S1 and S2 normal, no murmur, rub   or gallop  Abdomen:     Soft, non-tender, bowel sounds active all four quadrants,    no masses, no organomegaly  Extremities:   Extremities normal, atraumatic, no cyanosis or edema  Neurologic:   CNII-XII intact, normal strength, sensation and reflexes    throughout     Lab Results: Results for orders placed during the hospital encounter of 04/30/11 (from the past 24 hour(s))  GLUCOSE, CAPILLARY     Status: Abnormal   Collection Time   05/01/11 11:30 AM      Component Value Range   Glucose-Capillary 116 (*) 70 - 99 (mg/dL)   Comment 1 Notify RN     Comment 2 Documented in Chart    SODIUM, URINE, RANDOM     Status: Normal   Collection Time   05/01/11  4:46 PM      Component Value Range   Sodium, Ur 108    CREATININE, URINE, RANDOM     Status: Normal   Collection Time   05/01/11  4:46 PM      Component Value Range   Creatinine, Urine 119.2    PROTEIN, URINE, RANDOM     Status: Normal   Collection Time   05/01/11  4:46 PM      Component Value Range   Total Protein, Urine 21    GLUCOSE, CAPILLARY     Status: Abnormal   Collection Time   05/01/11  4:46 PM      Component Value Range   Glucose-Capillary 114 (*)  70 - 99 (mg/dL)   Comment 1 Notify RN     Comment 2 Documented in Chart    GLUCOSE, CAPILLARY     Status: Abnormal   Collection Time   05/01/11  8:54 PM      Component Value Range   Glucose-Capillary 118 (*) 70 - 99 (mg/dL)  COMPREHENSIVE METABOLIC PANEL     Status: Abnormal   Collection Time   05/02/11  4:47 AM      Component Value Range   Sodium 134 (*) 135 - 145 (mEq/L)   Potassium 3.5  3.5 - 5.1 (mEq/L)   Chloride 101  96 - 112 (mEq/L)   CO2 22  19 - 32 (mEq/L)   Glucose, Bld 105 (*) 70 - 99 (mg/dL)   BUN 16  6 - 23 (mg/dL)   Creatinine, Ser 1.30  0.50 - 1.10 (mg/dL)   Calcium 9.5  8.4 - 86.5 (mg/dL)   Total Protein 7.4  6.0 -  8.3 (g/dL)   Albumin 3.6  3.5 - 5.2 (g/dL)   AST 31  0 - 37 (U/L)   ALT 11  0 - 35 (U/L)   Alkaline Phosphatase 55  39 - 117 (U/L)   Total Bilirubin 0.3  0.3 - 1.2 (mg/dL)   GFR calc non Af Amer 50 (*) >60 (mL/min)   GFR calc Af Amer >60  >60 (mL/min)  CBC     Status: Abnormal   Collection Time   05/02/11  4:47 AM      Component Value Range   WBC 5.7  4.0 - 10.5 (K/uL)   RBC 3.74 (*) 3.87 - 5.11 (MIL/uL)   Hemoglobin 10.5 (*) 12.0 - 15.0 (g/dL)   HCT 21.3 (*) 08.6 - 46.0 (%)   MCV 81.6  78.0 - 100.0 (fL)   MCH 28.1  26.0 - 34.0 (pg)   MCHC 34.4  30.0 - 36.0 (g/dL)   RDW 57.8 (*) 46.9 - 15.5 (%)   Platelets 210  150 - 400 (K/uL)  DIFFERENTIAL     Status: Abnormal   Collection Time   05/02/11  4:47 AM      Component Value Range   Neutrophils Relative 42 (*) 43 - 77 (%)   Neutro Abs 2.4  1.7 - 7.7 (K/uL)   Lymphocytes Relative 40  12 - 46 (%)   Lymphs Abs 2.3  0.7 - 4.0 (K/uL)   Monocytes Relative 15 (*) 3 - 12 (%)   Monocytes Absolute 0.9  0.1 - 1.0 (K/uL)   Eosinophils Relative 2  0 - 5 (%)   Eosinophils Absolute 0.1  0.0 - 0.7 (K/uL)   Basophils Relative 0  0 - 1 (%)   Basophils Absolute 0.0  0.0 - 0.1 (K/uL)  GLUCOSE, CAPILLARY     Status: Abnormal   Collection Time   05/02/11  7:47 AM      Component Value Range   Glucose-Capillary 102  (*) 70 - 99 (mg/dL)   Comment 1 Notify RN     Comment 2 Documented in Chart      Micro Results: No results found for this or any previous visit (from the past 240 hour(s)). Studies/Results:    US Carotid Duplex Bilateral  05/01/2011   IMPRESSION: Tortuous systems carotid systems bilaterally.  Mild plaque formation at the right carotid bulb and distal right common carotid artery with questionably a small ulcerated plaque; recommend CTA imaging of the neck with contrast to evaluate. No evidence of hemodynamically significant stenosis by velocity measurements.  Original Report Authenticated By: Lollie Marrow, M.D.   Scheduled Meds:   . amLODipine  10 mg Oral Daily  . calcium-vitamin D  1 tablet Oral Daily  . cloNIDine  0.1 mg Oral TID  . clopidogrel  75 mg Oral Q breakfast  . enoxaparin  30 mg Subcutaneous Q24H  . fenofibrate  160 mg Oral Daily  . pantoprazole  40 mg Oral Q1200  . rosuvastatin  40 mg Oral q1800  . sodium chloride       Continuous Infusions:   . sodium chloride 125 mL/hr at 05/02/11 1030   PRN Meds:.acetaminophen Assessment/Plan:  Principal Problem:  *Acute cerebral infarction - awaiting neurology input, continue Plavix.  2-D echo as above with no embolic source reported. Carotid Dopplers with no significant ICA stenosis. Active Problems:  HYPERTENSION - Ok controll on Norvasc and clonidine.  HYPERLIPIDEMIA- continue Crestor.  Acute renal insufficiency - resolved  Volume depletion, unspecified - resolved with hydration,  will discontinue IV fluids.    LOS: 2 days   Lindsay Robertson 05/02/2011, 10:49 AM

## 2011-05-03 LAB — GLUCOSE, CAPILLARY
Glucose-Capillary: 98 mg/dL (ref 70–99)
Glucose-Capillary: 118 mg/dL — ABNORMAL HIGH (ref 70–99)

## 2011-05-03 MED ORDER — CLOPIDOGREL BISULFATE 75 MG PO TABS: 75.0000 mg | ORAL_TABLET | Freq: Every day | ORAL | Status: DC

## 2011-05-03 MED ORDER — ENOXAPARIN SODIUM 40 MG/0.4ML ~~LOC~~ SOLN
40.0000 mg | SUBCUTANEOUS | Status: DC
  Administered 2011-05-03: 40 mg via SUBCUTANEOUS
  Filled 2011-05-03: qty 0.4

## 2011-05-03 NOTE — Discharge Summary (Signed)
Physician Discharge Summary  Patient ID: Lindsay Robertson MRN: [id number] DOB/AGE: Mar 08, 1946 65 y.o.  Admit date: 04/30/2011 Discharge date: 05/03/2011  Primary Care Physician:  Syliva Overman, MD, MD   Discharge Diagnoses:  1.Acute left MCA distribution cerebral infarct 2.Acute renal insufficiency-resolved  3. Hypertension uncontrolled  4. volume depletion 5. Hyperlipidemia  Present on Admission:  .Acute cerebral infarction .Acute renal insufficiency .HYPERLIPIDEMIA .HYPERTENSION .Volume depletion, unspecified  Current Discharge Medication List    START taking these medications   Details  clopidogrel (PLAVIX) 75 MG tablet Take 1 tablet (75 mg total) by mouth daily with breakfast. Qty: 30 tablet, Refills: 0      CONTINUE these medications which have NOT CHANGED   Details  amLODipine-olmesartan (AZOR) 10-40 MG per tablet Take 1 tablet by mouth daily.      atorvastatin (LIPITOR) 80 MG tablet Take 80 mg by mouth daily.      calcium-vitamin D (OSCAL WITH D) 500-200 MG-UNIT per tablet Take 1 tablet by mouth daily.      Choline Fenofibrate (TRILIPIX) 135 MG capsule Take 135 mg by mouth daily.      cloNIDine (CATAPRES) 0.2 MG tablet Take 0.2 mg by mouth daily.      lisinopril-hydrochlorothiazide (PRINZIDE,ZESTORETIC) 10-12.5 MG per tablet Take 1 tablet by mouth daily.      clotrimazole-betamethasone (LOTRISONE) cream Apply topically 2 (two) times daily. Qty: 45 g, Refills: 2   Associated Diagnoses: Dermatomycosis      STOP taking these medications     aspirin 81 MG tablet          Disposition and Follow-up: She has been discharged in improved and stable condition outpatient followup with her primary care physician and neurology.   Consults:  Neurology, Dr Gerilyn Pilgrim   Significant Diagnostic Studies:  US Carotid Duplex Bilateral   IMPRESSION: Tortuous systems carotid systems bilaterally.  Mild plaque formation at the right carotid bulb and distal right common  carotid artery with questionably a small ulcerated plaque; recommend CTA imaging of the neck with contrast to evaluate. No evidence of hemodynamically significant stenosis by velocity measurements.  .  MRI IMPRESSION:  Acute left MCA branch vessel distribution infarction affecting the  upper posterior temporal lobe, posterior insular region and in the  left frontoparietal region. The area of involvement measures about  4 cm in size. No hemorrhage.  Extensive chronic small vessel infarctions elsewhere throughout the  brain.   MRA HEAD  IMPRESSION:  No correctable proximal anterior circulation pathology. Missing  branch vessels in the left MCA territory corresponding to the area  of acute infarction.  Diminutive posterior circulation. No antegrade flow is seen within  the distal left vertebral artery. Small and diseased left vertebral  artery patent to the basilar with mid basilar stenosis. Both  posterior cerebral arteries probably take a fetal origin from the  anterior circulation.  2D-ECHO - Left ventricle: The cavity size was normal. Wall thickness was increased in a pattern of mild LVH. Systolic function was vigorous. The estimated ejection fraction was in the range of 65% to 70%. Wall motion was normal; there were no regional wall motion abnormalities. Doppler parameters are consistent with abnormal left ventricular relaxation (grade 1 diastolic dysfunction). - Mitral valve: Calcified annulus. Trivial regurgitation. - Left atrium: The atrium was mildly dilated. - Atrial septum: No defect or patent foramen ovale was identified. - Tricuspid valve: Trivial regurgitation. - Pericardium, extracardiac: There was no pericardial effusion.  CT HEAD IMPRESSION:  Atrophy with small vessel chronic ischemic changes  of deep cerebral  white matter.  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  No acute intracranial abnormalities.     Brief H and P: For  complete details please refer to admission H and P, but in brief the patient is 65 year old female with hx of htn/hyperlipidermia, came in with confusion since  morning admission . Her husband says she went to bed Ok that night, but patient acted confused while coming up the stairs from their kitchen. She was reportedly not herself and seemed to be weak on the right side. Her speech seemed slurred. She also felt nauseated, and had a couple bowel movements of loose stool. She denied hematochezia/melena or change in stool color. No headaches or fever. Her husband took her to Dr simpson's office, where she was referred to the ER. Per her husband, she was still not herself at the office, but she was more at baseline on admission in the hospital. 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 She has been taking a baby aspirin daily. She was admitted for further evaluation and management.    Hospital Course:  Principal Problem:  *Acute cerebral infarction,acute left MCA distribution- as discussed above come upon admission she had a CT scan of her head which showed no acute findings. As I noted that she was already on aspirin at home, so it was changed to Plavix in the hospital. And MRI of her brain was done and the results as stated above showing an acute left MCA branch vessel distribution infarction affecting her upper posterior temporal, posterior insular region and the left frontoparietal region. A 2-D echocardiogram was done and an ejection fraction of 65-70% and a grade 1 diastolic dysfunction are noted. Wall motion was normal. She also had a carotid Doppler ultrasound done and this no evidence of hemodynamically significant stenosis. MRA did not show any correctable the anterior circulation pathology. Neurology was consulted and Dr. Gerilyn Pilgrim followed the patient in the hospital. He agreed with the plavix. PT saw the patient stated that she did not have any acute PT needs.  Her speech and mentation improved her to baselinewhile in the hospital as per husband. Spell clinically stable for discharge at this time and is to follow up outpatient as above. Active Problems:  HYPERTENSION- the patient's blood pressures were controlled on her outpatient medications during her hospital stay and she is to continue them upon discharge.  HYPERLIPIDEMIA- she is to continue her outpatient medications upon discharge.  Acute renal insufficiency- that this was secondary to volume depletion resolved with hydration. Her last creatinine prior to discharge is a 1.09  Volume depletion, unspecified- resolved with hydration.    Time spent on Discharge:  Signed: Virgil Slinger C 05/03/2011, 11:50 AM

## 2011-05-03 NOTE — Progress Notes (Signed)
Reviewed discharge instructions with client. Client discharged to home with spouse. Client to be escorted to vehicle by staff. Client discharged in stable condition.

## 2011-05-03 NOTE — Progress Notes (Signed)
Subjective: Interval History: No new complaints are reported. She reports that she is improving in regards to her speech.  Objective: Vital signs in last 24 hours: Temp:  [98 F (36.7 C)-98.7 F (37.1 C)] 98 F (36.7 C) (09/21 0514) Pulse Rate:  [58-68] 58  (09/21 0514) Resp:  [16-20] 20  (09/21 0514) BP: (127-150)/(69-84) 135/79 mmHg (09/21 0514) SpO2:  [98 %-100 %] 100 % (09/21 0514)  Intake/Output from previous day: 09/20 0701 - 09/21 0700 In: 1080 [P.O.:1080] Out: 950 [Urine:950] Intake/Output this shift:   Nutritional status: Sodium Restricted  PE GENERAL: Pleasant lady in no acute distress.  HEENT: Supple. Atraumatic normocephalic.  ABDOMEN: soft  EXTREMITIES: No edema  BACK: Normal.  SKIN: Normal by inspection.  MENTAL STATUS: Alert and oriented. Speech and cognition are generally intact. Judgment and insight normal. She does seem to have some issues with naming although this has improved. Comprehension has also improved. The patient was able to name 3/5 objects tested although she took a long time. CRANIAL NERVES: Pupils are equal, round and reactive to light and accomodation; extra ocular movements are full, there is no significant nystagmus; visual fields are full; upper and lower facial muscles are normal in strength and symmetric, there is no flattening of the nasolabial folds; tongue is midline; uvula is midline; shoulder elevation is normal.  MOTOR: Normal tone, bulk and strength; no pronator drift.  COORDINATION: Left finger to nose is normal, right finger to nose is normal, No rest tremor; no intention tremor; no postural tremor; no bradykinesia.  REFLEXES: Deep tendon reflexes are symmetrical and normal. Babinski reflexes are extensor bilaterally.  SENSATION: Normal to light touch.   Lab Results:  Basename 05/02/11 0447 04/30/11 1455  WBC 5.7 7.0  HGB 10.5* 11.0*  HCT 30.5* 32.5*  PLT 210 250  NA 134* 135  K 3.5 3.6  CL 101 97  CO2 22 26  GLUCOSE 105*  157*  BUN 16 25*  CREATININE 1.09 1.46*  CALCIUM 9.5 10.0  LABA1C -- --   Lipid Panel  Basename 05/01/11 0440  CHOL 174  TRIG 69  HDL 53  CHOLHDL 3.3  VLDL 14  LDLCALC 604*    Studies/Results: US Carotid Duplex Bilateral  05/01/2011  *RADIOLOGY REPORT*  Clinical Data: Stroke, hypertension  BILATERAL CAROTID DUPLEX ULTRASOUND  Technique: Gray scale imaging, color Doppler and duplex ultrasound was performed of bilateral carotid and vertebral arteries in the neck.  Comparison:  04/12/2010  Criteria:  Quantification of carotid stenosis is based on velocity parameters that correlate the residual internal carotid diameter with NASCET-based stenosis levels, using the diameter of the distal internal carotid lumen as the denominator for stenosis measurement.  The following velocity measurements were obtained:                   PEAK SYSTOLIC/END DIASTOLIC RIGHT ICA:                        82/18cm/sec CCA:                        96/14cm/sec SYSTOLIC ICA/CCA RATIO:     0.85 DIASTOLIC ICA/CCA RATIO:    1.33 ECA:                        104cm/sec  LEFT ICA:  108/22cm/sec CCA:                        99/16cm/sec SYSTOLIC ICA/CCA RATIO:     1.09 DIASTOLIC ICA/CCA RATIO:    1.35 ECA:                        57cm/sec  Findings:  RIGHT CAROTID ARTERY: Minimally tortuous right carotid system. Intimal thickening right CCA.  Plaque identified at distal right CCA end bulb, questionably showing a central ulceration.  Turbulent flow left carotid bifurcation on color Doppler imaging as well as within proximal right ICA.  Spectral broadening in right ICA on waveform analysis.  No high velocity jets.  RIGHT VERTEBRAL ARTERY:  Patent, antegrade  LEFT CAROTID ARTERY: Tortuous left carotid system.  Intimal thickening left CCA.  High bifurcation.  Mildly turbulent flow at left ICA on color Doppler imaging likely due to tortuosity, with associated spectral broadening. Minimal plaque identified. No high velocity  jets.  LEFT VERTEBRAL ARTERY:  Patent, antegrade  IMPRESSION: Tortuous systems carotid systems bilaterally.  Mild plaque formation at the right carotid bulb and distal right common carotid artery with questionably a small ulcerated plaque; recommend CTA imaging of the neck with contrast to evaluate. No evidence of hemodynamically significant stenosis by velocity measurements.  Original Report Authenticated By: Lollie Marrow, M.D.    Medications:  Scheduled Meds:   . amLODipine  10 mg Oral Daily  . calcium-vitamin D  1 tablet Oral Daily  . cloNIDine  0.1 mg Oral TID  . clopidogrel  75 mg Oral Q breakfast  . enoxaparin  30 mg Subcutaneous Q24H  . fenofibrate  160 mg Oral Daily  . pantoprazole  40 mg Oral Q1200  . rosuvastatin  40 mg Oral q1800   Continuous Infusions:   . DISCONTD: sodium chloride 125 mL/hr at 05/02/11 1030   PRN Meds:.acetaminophen  ECHO - Left ventricle: The cavity size was normal. Wall thickness was increased in a pattern of mild LVH. Systolic function was vigorous. The estimated ejection fraction was in the range of 65% to 70%. Wall motion was normal; there were no regional wall motion abnormalities. Doppler parameters are consistent with abnormal left ventricular relaxation (grade 1 diastolic dysfunction). - Mitral valve: Calcified annulus. Trivial regurgitation. - Left atrium: The atrium was mildly dilated. - Atrial septum: No defect or patent foramen ovale was identified. - Tricuspid valve: Trivial regurgitation. - Pericardium, extracardiac: There was no pericardial effusion.   Assessment/Plan: Acute left operculum infarct. Risk factors hyperglycemia, hypertension and age. Continue with risk factor modification including blood pressure control and blood sugar control. She is to continue on the Plavix.   LOS: 3 days   Lindsay Robertson

## 2011-05-05 DIAGNOSIS — R410 Disorientation, unspecified: Secondary | ICD-10-CM | POA: Insufficient documentation

## 2011-05-05 NOTE — Progress Notes (Signed)
  Subjective:    Patient ID: Lindsay Robertson, female    DOB: 07-Feb-1946, 65 y.o.   MRN: [id number]  HPI Acute onset of increased gait instability and confusion, no recent fever or chills, however note to have loose stools one day ago.    Review of Systems Denies recent fever or chills. Denies sinus pressure, nasal congestion, ear pain or sore throat. Denies chest congestion, productive cough or wheezing. Denies chest pains, palpitations and leg swelling Denies abdominal pain, nausea, vomiting,diarrhea or constipation.   Denies dysuria, frequency, hesitancy or incontinence. Denies joint pain, swelling and limitation in mobility. Denies headaches, seizures, numbness, or tingling.         Objective:   Physical Exam Patient alert and mildly confused and in no cardiopulmonary distress.  HEENT: No facial asymmetry, EOMI, no sinus tenderness,  oropharynx pink and moist.  Neck supple no adenopathy.  Chest: Clear to auscultation bilaterally.  CVS: S1, S2 no murmurs, no S3.  ABD: Soft non tender. Bowel sounds normal.  Ext: No edema  Psych: Good eye contact, normal affect. Memory impaired, very anxious  CNS: CN 2-12 intact, power,normal throughout.        Assessment & Plan:

## 2011-05-05 NOTE — Assessment & Plan Note (Signed)
Adequate control. 

## 2011-05-05 NOTE — Assessment & Plan Note (Signed)
Acute confusion, concerning for cerebro vascular accident, direct contact made with Ed for further eval

## 2011-05-23 ENCOUNTER — Encounter: Payer: Self-pay | Admitting: Family Medicine

## 2011-05-29 ENCOUNTER — Ambulatory Visit (INDEPENDENT_AMBULATORY_CARE_PROVIDER_SITE_OTHER): Payer: Medicare Other | Admitting: Family Medicine

## 2011-05-29 ENCOUNTER — Encounter: Payer: Self-pay | Admitting: Family Medicine

## 2011-05-29 VITALS — BP 122/78 | HR 82 | Resp 16 | Ht 65.0 in | Wt 159.4 lb

## 2011-05-29 DIAGNOSIS — E785 Hyperlipidemia, unspecified: Secondary | ICD-10-CM

## 2011-05-29 DIAGNOSIS — I639 Cerebral infarction, unspecified: Secondary | ICD-10-CM

## 2011-05-29 DIAGNOSIS — I10 Essential (primary) hypertension: Secondary | ICD-10-CM

## 2011-05-29 DIAGNOSIS — R7301 Impaired fasting glucose: Secondary | ICD-10-CM

## 2011-05-29 DIAGNOSIS — I6350 Cerebral infarction due to unspecified occlusion or stenosis of unspecified cerebral artery: Secondary | ICD-10-CM

## 2011-05-29 DIAGNOSIS — R5381 Other malaise: Secondary | ICD-10-CM

## 2011-05-29 DIAGNOSIS — R5383 Other fatigue: Secondary | ICD-10-CM

## 2011-05-29 NOTE — Assessment & Plan Note (Signed)
No residual motor or neurologic deficit

## 2011-05-29 NOTE — Patient Instructions (Signed)
F/U in 3 months.  Fasting lipid, hepatic, chem 7 and HBA1C in 3 months, before visit  LABWORK  NEEDS TO BE DONE BETWEEN 3 TO 7 DAYS BEFORE YOUR NEXT SCEDULED  VISIT.  THIS WILL IMPROVE THE QUALITY OF YOUR CARE.   No med changes at this time

## 2011-05-29 NOTE — Assessment & Plan Note (Signed)
Hyperlipidemia:Low fat diet discussed and encouraged.   

## 2011-05-29 NOTE — Assessment & Plan Note (Signed)
Controlled, no change in medication  

## 2011-06-02 ENCOUNTER — Other Ambulatory Visit (HOSPITAL_COMMUNITY): Payer: Self-pay | Admitting: Internal Medicine

## 2011-06-02 ENCOUNTER — Encounter: Payer: Self-pay | Admitting: Family Medicine

## 2011-06-02 NOTE — Progress Notes (Signed)
  Subjective:    Patient ID: Lindsay Robertson, female    DOB: [birth year], 65 y.o.   MRN: [id number]  HPI Pt in for hospital f/u had recent stroke and was hospitalized 09/18 to 05/03/2011 .She denies any residual weakness or numbness.She is now on plavix, which is new. No concerns at this visit, feels well. I advised pt and her spouse , should she have a recurrence of acute neurologic symptoms she should go immediately to th ED   Review of Systems See HPI Denies recent fever or chills. Denies sinus pressure, nasal congestion, ear pain or sore throat. Denies chest congestion, productive cough or wheezing. Denies chest pains, palpitations and leg swelling Denies abdominal pain, nausea, vomiting,diarrhea or constipation.   Denies dysuria, frequency, hesitancy or incontinence. Denies joint pain, swelling and limitation in mobility. Denies headaches, seizures, numbness, or tingling. Denies depression, anxiety or insomnia. Denies skin break down or rash.        Objective:   Physical Exam Patient alert and oriented and in no cardiopulmonary distress.  HEENT: No facial asymmetry, EOMI, no sinus tenderness,  oropharynx pink and moist.  Neck supple no adenopathy.  Chest: Clear to auscultation bilaterally.  CVS: S1, S2 no murmurs, no S3.  ABD: Soft non tender. Bowel sounds normal.  Ext: No edema  MS: Adequate ROM spine, shoulders, hips and knees.  Skin: Intact, no ulcerations or rash noted.  Psych: Good eye contact, normal affect. Memory mildly impaired,not anxious or depressed appearing.  CNS: CN 2-12 intact, power, tone and sensation normal throughout.        Assessment & Plan:

## 2011-06-03 ENCOUNTER — Telehealth: Payer: Self-pay | Admitting: Family Medicine

## 2011-06-03 MED ORDER — CLOPIDOGREL BISULFATE 75 MG PO TABS: 75.0000 mg | ORAL_TABLET | Freq: Every day | ORAL | Status: DC

## 2011-06-03 NOTE — Telephone Encounter (Signed)
pls refill plavix x5

## 2011-06-03 NOTE — Telephone Encounter (Signed)
Patient aware.

## 2011-06-17 ENCOUNTER — Telehealth: Payer: Self-pay | Admitting: Family Medicine

## 2011-06-17 DIAGNOSIS — B369 Superficial mycosis, unspecified: Secondary | ICD-10-CM

## 2011-06-24 MED ORDER — CLOTRIMAZOLE-BETAMETHASONE 1-0.05 % EX CREA: TOPICAL_CREAM | Freq: Two times a day (BID) | CUTANEOUS | Status: DC

## 2011-06-24 NOTE — Telephone Encounter (Signed)
Cream refilled

## 2011-07-08 ENCOUNTER — Other Ambulatory Visit: Payer: Self-pay

## 2011-07-08 MED ORDER — CLOPIDOGREL BISULFATE 75 MG PO TABS: 75.0000 mg | ORAL_TABLET | Freq: Every day | ORAL | Status: DC

## 2011-07-09 ENCOUNTER — Telehealth: Payer: Self-pay

## 2011-07-09 NOTE — Telephone Encounter (Signed)
Wanted fluocinonide (lidex) 0.05% cream, not the one that was filled on her medlist

## 2011-07-10 ENCOUNTER — Other Ambulatory Visit: Payer: Self-pay | Admitting: Family Medicine

## 2011-07-10 MED ORDER — FLUOCINONIDE 0.05 % EX CREA: TOPICAL_CREAM | CUTANEOUS | Status: DC

## 2011-07-10 NOTE — Telephone Encounter (Signed)
Lidex has been sent to [facility] pls let her know

## 2011-07-22 ENCOUNTER — Emergency Department (HOSPITAL_COMMUNITY)
Admission: EM | Admit: 2011-07-22 | Discharge: 2011-07-22 | Disposition: A | Discharge status: Home / Self Care | Payer: Medicare Other | Attending: Emergency Medicine | Admitting: Emergency Medicine

## 2011-07-22 ENCOUNTER — Encounter (HOSPITAL_COMMUNITY): Payer: Self-pay | Admitting: *Deleted

## 2011-07-22 DIAGNOSIS — R51 Headache: Secondary | ICD-10-CM | POA: Insufficient documentation

## 2011-07-22 DIAGNOSIS — I10 Essential (primary) hypertension: Secondary | ICD-10-CM | POA: Insufficient documentation

## 2011-07-22 DIAGNOSIS — F419 Anxiety disorder, unspecified: Secondary | ICD-10-CM

## 2011-07-22 DIAGNOSIS — F411 Generalized anxiety disorder: Secondary | ICD-10-CM | POA: Insufficient documentation

## 2011-07-22 DIAGNOSIS — Z8673 Personal history of transient ischemic attack (TIA), and cerebral infarction without residual deficits: Secondary | ICD-10-CM | POA: Insufficient documentation

## 2011-07-22 DIAGNOSIS — R55 Syncope and collapse: Secondary | ICD-10-CM | POA: Insufficient documentation

## 2011-07-22 DIAGNOSIS — E785 Hyperlipidemia, unspecified: Secondary | ICD-10-CM | POA: Insufficient documentation

## 2011-07-22 MED ORDER — HYDROCODONE-ACETAMINOPHEN 5-325 MG PO TABS: 1.0000 | ORAL_TABLET | Freq: Four times a day (QID) | ORAL | Status: AC | PRN

## 2011-07-22 MED ORDER — LORAZEPAM 1 MG PO TABS
1.0000 mg | ORAL_TABLET | Freq: Once | ORAL | Status: AC
  Administered 2011-07-22: 1 mg via ORAL
  Filled 2011-07-22: qty 1

## 2011-07-22 MED ORDER — HYDROCODONE-ACETAMINOPHEN 5-325 MG PO TABS
1.0000 | ORAL_TABLET | Freq: Once | ORAL | Status: AC
  Administered 2011-07-22: 1 via ORAL
  Filled 2011-07-22: qty 1

## 2011-07-22 MED ORDER — LORAZEPAM 1 MG PO TABS: 1.0000 mg | ORAL_TABLET | Freq: Three times a day (TID) | ORAL | Status: AC | PRN

## 2011-07-22 NOTE — ED Notes (Signed)
Patient has been under a lot of stress due to recent loss of significant other

## 2011-07-22 NOTE — ED Provider Notes (Signed)
History     CSN: [id number] Arrival date & time: 07/22/2011  8:48 PM   First MD Initiated Contact with Patient 07/22/11 2124      Chief Complaint  Patient presents with  . Headache    (Consider location/radiation/quality/duration/timing/severity/associated sxs/prior treatment) HPI Comments: Pt reports her Fiancee died nearly 1 week ago. She has had head ache and difficulty sleeping since that time. No hx of trauma. No neurologic changes. She reports the left arm feeling "hot", but this has been going on for months.  Patient is a 65 y.o. female presenting with headaches. The history is provided by the patient.  Headache  This is a recurrent problem. The current episode started more than 2 days ago. The problem occurs constantly. The headache is associated with emotional stress. The pain is located in the temporal and occipital region. The quality of the pain is described as sharp. The pain is moderate. The pain does not radiate. Associated symptoms include near-syncope. Pertinent negatives include no anorexia, no fever, no palpitations and no shortness of breath. Treatments tried: her own medication. The treatment provided no relief.    Past Medical History  Diagnosis Date  . Hyperlipidemia   . Hypertension   . Allergy   . 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 Cataract extraction     right eye  . Tubal ligation     Family History  Problem Relation Age of Onset  . Cancer Mother     breast  . Hypertension Mother   . Diabetes Father   . Hypertension Father   . Diabetes Sister   . Diabetes Brother   . Stroke Maternal Grandmother   . Hypertension Maternal Grandmother   . Diabetes Paternal Grandmother   . Cancer Brother     colon x 1  . Heart disease Brother     massive heart attack x 1  . Diabetes Brother     x1    History  Substance Use Topics  . Smoking status: Never Smoker   . Smokeless tobacco: Not on file  . Alcohol  Use: Yes    OB History    Grav Para Term Preterm Abortions TAB SAB Ect Mult Living                  Review of Systems  Constitutional: Negative for fever and activity change.       All ROS Neg except as noted in HPI  HENT: Negative for nosebleeds and neck pain.   Eyes: Negative for photophobia and discharge.  Respiratory: Negative for cough, shortness of breath and wheezing.   Cardiovascular: Positive for near-syncope. Negative for chest pain and palpitations.  Gastrointestinal: Negative for abdominal pain, blood in stool and anorexia.  Genitourinary: Negative for dysuria, frequency and hematuria.  Musculoskeletal: Negative for back pain and arthralgias.  Skin: Negative.   Neurological: Positive for headaches. Negative for dizziness, seizures and speech difficulty.  Psychiatric/Behavioral: Negative for hallucinations and confusion.    Allergies  Review of patient's allergies indicates no known allergies.  Home Medications   Current Outpatient Rx  Name Route Sig Dispense Refill  . AMLODIPINE-OLMESARTAN 10-40 MG PO TABS Oral Take 1 tablet by mouth daily.      . ATORVASTATIN CALCIUM 80 MG PO TABS Oral Take 80 mg by mouth at bedtime.     Marland Kitchen CALCIUM CARBONATE-VITAMIN D 500-200 MG-UNIT PO TABS Oral Take 1 tablet by mouth daily.      Marland Kitchen  CHOLINE FENOFIBRATE 135 MG PO CPDR Oral Take 135 mg by mouth at bedtime.     Marland Kitchen CLONIDINE HCL 0.2 MG PO TABS Oral Take 0.2 mg by mouth at bedtime.     . CLOPIDOGREL BISULFATE 75 MG PO TABS Oral Take 75 mg by mouth every morning.      Marland Kitchen CLOTRIMAZOLE-BETAMETHASONE 1-0.05 % EX CREA Topical Apply topically 2 (two) times daily. 45 g 2  . FLUOCINONIDE 0.05 % EX CREA  Apply to affected area daily 30 g 1    BP 161/78  Pulse 101  Temp(Src) 98.5 F (36.9 C) (Oral)  Resp 16  Ht 5\' 5"  (1.651 m)  Wt 164 lb (74.39 kg)  BMI 27.29 kg/m2  SpO2 100%  Physical Exam  Nursing note and vitals reviewed. Constitutional: She is oriented to person, place, and time.  She appears well-developed and well-nourished.  Non-toxic appearance.  HENT:  Head: Normocephalic.  Right Ear: Tympanic membrane and external ear normal.  Left Ear: Tympanic membrane and external ear normal.  Eyes: EOM and lids are normal. Pupils are equal, round, and reactive to light.  Neck: Normal range of motion. Neck supple. Carotid bruit is not present.  Cardiovascular: Normal rate, regular rhythm, normal heart sounds, intact distal pulses and normal pulses.   Pulmonary/Chest: Breath sounds normal. No respiratory distress.  Abdominal: Soft. Bowel sounds are normal. There is no tenderness. There is no guarding.  Musculoskeletal: Normal range of motion.  Lymphadenopathy:       Head (right side): No submandibular adenopathy present.       Head (left side): No submandibular adenopathy present.    She has no cervical adenopathy.  Neurological: She is alert and oriented to person, place, and time. She has normal strength. No cranial nerve deficit or sensory deficit. She exhibits normal muscle tone. Coordination normal.  Skin: Skin is warm and dry.  Psychiatric: She has a normal mood and affect. Her speech is normal.    ED Course  Procedures (including critical care time)  Labs Reviewed - No data to display No results found.   No diagnosis found.    MDM  I have reviewed nursing notes, vital signs, and all appropriate lab and imaging results for this patient.       Kathie Dike, [place] 07/23/11 [phone]

## 2011-07-22 NOTE — ED Notes (Signed)
Back of head hurts.  Lt arm feels "hot"  Fiancee died last week.

## 2011-07-23 NOTE — ED Provider Notes (Signed)
Medical screening examination/treatment/procedure(s) were performed by non-physician practitioner and as supervising physician I was immediately available for consultation/collaboration.   Joya Gaskins, MD 07/23/11 [phone]

## 2011-08-28 ENCOUNTER — Encounter: Payer: Self-pay | Admitting: Family Medicine

## 2011-08-29 ENCOUNTER — Ambulatory Visit (INDEPENDENT_AMBULATORY_CARE_PROVIDER_SITE_OTHER): Payer: Medicare Other | Admitting: Family Medicine

## 2011-08-29 ENCOUNTER — Encounter: Payer: Self-pay | Admitting: Family Medicine

## 2011-08-29 VITALS — BP 180/92 | HR 99 | Resp 18 | Ht 65.0 in | Wt 149.0 lb

## 2011-08-29 DIAGNOSIS — E785 Hyperlipidemia, unspecified: Secondary | ICD-10-CM

## 2011-08-29 DIAGNOSIS — I10 Essential (primary) hypertension: Secondary | ICD-10-CM

## 2011-08-29 DIAGNOSIS — L909 Atrophic disorder of skin, unspecified: Secondary | ICD-10-CM

## 2011-08-29 DIAGNOSIS — R5381 Other malaise: Secondary | ICD-10-CM

## 2011-08-29 DIAGNOSIS — L918 Other hypertrophic disorders of the skin: Secondary | ICD-10-CM | POA: Insufficient documentation

## 2011-08-29 DIAGNOSIS — R5383 Other fatigue: Secondary | ICD-10-CM

## 2011-08-29 DIAGNOSIS — D649 Anemia, unspecified: Secondary | ICD-10-CM

## 2011-08-29 DIAGNOSIS — R7301 Impaired fasting glucose: Secondary | ICD-10-CM

## 2011-08-29 MED ORDER — AMLODIPINE BESYLATE 10 MG PO TABS: 10.0000 mg | ORAL_TABLET | Freq: Every day | ORAL | Status: AC

## 2011-08-29 NOTE — Assessment & Plan Note (Signed)
Uncontrolled, no longer has access to azor, amlodipine prescribed, f/u in 6 weeks

## 2011-08-29 NOTE — Patient Instructions (Signed)
F/U end Feb  You are referred to dermatology about the skin tag on your face.  Your abdominal exam is normal.  Your blood pressure is high, and additional medication is sent to the pharmacy, amlodipine , for your blood pressure.  This is in place of azor.   HBA1C , chem 7, cbc anf iron level and TSH today  I am sorry about your loss

## 2011-08-29 NOTE — Assessment & Plan Note (Signed)
Enlarging tag on chin, derm eval

## 2011-08-29 NOTE — Assessment & Plan Note (Signed)
Hyperlipidemia:Low fat diet discussed and encouraged.  Lab today 

## 2011-08-30 LAB — TSH: TSH: 2.383 u[IU]/mL (ref 0.350–4.500)

## 2011-08-30 LAB — BASIC METABOLIC PANEL
BUN: 17 mg/dL (ref 6–23)
CO2: 23 mEq/L (ref 19–32)
Calcium: 11.1 mg/dL — ABNORMAL HIGH (ref 8.4–10.5)
Creat: 1.19 mg/dL — ABNORMAL HIGH (ref 0.50–1.10)
Glucose, Bld: 93 mg/dL (ref 70–99)

## 2011-08-30 LAB — CBC WITH DIFFERENTIAL/PLATELET
Basophils Absolute: 0 10*3/uL (ref 0.0–0.1)
Basophils Relative: 0 % (ref 0–1)
Eosinophils Absolute: 0 10*3/uL (ref 0.0–0.7)
Hemoglobin: 11.4 g/dL — ABNORMAL LOW (ref 12.0–15.0)
MCHC: 33 g/dL (ref 30.0–36.0)
Neutro Abs: 3.6 10*3/uL (ref 1.7–7.7)
Neutrophils Relative %: 52 % (ref 43–77)
Platelets: 266 10*3/uL (ref 150–400)
RDW: 15.5 % (ref 11.5–15.5)

## 2011-08-30 LAB — IRON: Iron: 95 ug/dL (ref 42–145)

## 2011-08-30 LAB — HEMOGLOBIN A1C: Mean Plasma Glucose: 117 mg/dL — ABNORMAL HIGH (ref <117)

## 2011-09-02 NOTE — Progress Notes (Signed)
  Subjective:    Patient ID: Lindsay Robertson, female    DOB: 1946/04/17, 66 y.o.   MRN: [id number]  HPI The PT is here for follow up and re-evaluation of chronic medical conditions, medication management and review of any available recent lab and radiology data.  Preventive health is updated, specifically  Cancer screening and Immunization.   Questions or concerns regarding consultations or procedures which the PT has had in the interim are  addressed. The PT denies any adverse reactions to current medications since the last visit.  Unexpectedly lost her partner in December, grieving currently, may relocate to [facility] in the Summer where she is from. C/o skin tag on chin increasing in size C/o intermittent lower abdominal pain, no change in bowel movements    Review of Systems See HPI Denies recent fever or chills. Denies sinus pressure, nasal congestion, ear pain or sore throat. Denies chest congestion, productive cough or wheezing. Denies chest pains, palpitations and leg swelling Denies abdominal pain, nausea, vomiting,diarrhea or constipation.   Denies dysuria, frequency, hesitancy or incontinence. Denies joint pain, swelling and limitation in mobility. Denies headaches, seizures, numbness, or tingling. Denies depression, anxiety or insomnia.         Objective:   Physical Exam  Patient alert and oriented and in no cardiopulmonary distress.  HEENT: No facial asymmetry, EOMI, no sinus tenderness,  oropharynx pink and moist.  Neck supple no adenopathy.  Chest: Clear to auscultation bilaterally.  CVS: S1, S2 no murmurs, no S3.  ABD: Soft non tender. Bowel sounds normal.  Ext: No edema  MS: Adequate ROM spine, shoulders, hips and knees.  Skin: Intact, skin tag on chin enlarged in size.  Psych: Good eye contact, normal affect. Memory intact not anxious or depressed appearing.  CNS: CN 2-12 intact, power, tone and sensation normal throughout.       Assessment &  Plan:

## 2011-09-05 ENCOUNTER — Other Ambulatory Visit: Payer: Self-pay

## 2011-10-09 ENCOUNTER — Ambulatory Visit (INDEPENDENT_AMBULATORY_CARE_PROVIDER_SITE_OTHER): Payer: Medicare Other | Admitting: Family Medicine

## 2011-10-09 ENCOUNTER — Encounter: Payer: Self-pay | Admitting: Family Medicine

## 2011-10-09 VITALS — BP 150/80 | HR 69 | Resp 16 | Ht 65.0 in | Wt 142.0 lb

## 2011-10-09 DIAGNOSIS — F329 Major depressive disorder, single episode, unspecified: Secondary | ICD-10-CM

## 2011-10-09 DIAGNOSIS — F3289 Other specified depressive episodes: Secondary | ICD-10-CM

## 2011-10-09 DIAGNOSIS — R634 Abnormal weight loss: Secondary | ICD-10-CM

## 2011-10-09 DIAGNOSIS — G4700 Insomnia, unspecified: Secondary | ICD-10-CM

## 2011-10-09 DIAGNOSIS — E785 Hyperlipidemia, unspecified: Secondary | ICD-10-CM

## 2011-10-09 DIAGNOSIS — I10 Essential (primary) hypertension: Secondary | ICD-10-CM

## 2011-10-09 MED ORDER — FLUOXETINE HCL 10 MG PO CAPS: 10.0000 mg | ORAL_CAPSULE | Freq: Every day | ORAL | Status: AC

## 2011-10-09 MED ORDER — HYDROXYZINE HCL 10 MG PO TABS: ORAL_TABLET | ORAL | Status: DC

## 2011-10-09 NOTE — Patient Instructions (Signed)
F/u in 6 weeks.  New medication for depression and sleep.  You are referred for counseling , and to the stomach specialist about your weight loss You will be contacted on your cell if you are not a t home, # [phone]  You will improve

## 2011-10-11 ENCOUNTER — Telehealth: Payer: Self-pay | Admitting: Family Medicine

## 2011-10-11 MED ORDER — CLONIDINE HCL 0.2 MG PO TABS: 0.2000 mg | ORAL_TABLET | Freq: Every day | ORAL | Status: DC

## 2011-10-11 NOTE — Telephone Encounter (Signed)
Sent in as requested 

## 2011-10-13 NOTE — Assessment & Plan Note (Signed)
Hyperlipidemia:Low fat diet discussed and encouraged.  Updated labs needed, no med change at this time 

## 2011-10-13 NOTE — Assessment & Plan Note (Addendum)
Deterioration in symptoms, pt now agreeing to counseling, not suicidal or homcidal, she will also start medication

## 2011-10-13 NOTE — Progress Notes (Signed)
  Subjective:    Patient ID: Lindsay Robertson, female    DOB: [birth year], 66 y.o.   MRN: [id number]  HPI The PT is here for follow up and re-evaluation of chronic medical conditions, medication management and review of any available recent lab and radiology data.  Preventive health is updated, specifically  Cancer screening and Immunization.   Questions or concerns regarding consultations or procedures which the PT has had in the interim are  addressed. The PT denies any adverse reactions to current medications since the last visit.  She continues to experience excessive crying spells, feeling overwhelmed, social withdrawal, and feeling depressed, states she is now willing to get help. Her depression screen today is positive as she grieves the recent loss of her partner. She has poor appetite and excessive weight loss, denies change in stool character, colonoscopy is past due.      Review of Systems See HPI Denies recent fever or chills. Denies sinus pressure, nasal congestion, ear pain or sore throat. Denies chest congestion, productive cough or wheezing. Denies chest pains, palpitations and leg swelling Denies abdominal pain, nausea, vomiting,diarrhea or constipation.   Denies dysuria, frequency, hesitancy or incontinence. Denies joint pain, swelling and limitation in mobility. Denies headaches, seizures, numbness, or tingling. Denies suicidal or homicidal ideation Denies skin break down or rash.        Objective:   Physical Exam  Patient alert and oriented and in no cardiopulmonary distress.Tearful, depressed  HEENT: No facial asymmetry, EOMI, no sinus tenderness,  oropharynx pink and moist.  Neck supple no adenopathy.  Chest: Clear to auscultation bilaterally.  CVS: S1, S2 no murmurs, no S3.  ABD: Soft non tender. Bowel sounds normal.  Ext: No edema  MS: Adequate ROM spine, shoulders, hips and knees.  Skin: Intact, no ulcerations or rash noted.  Psych: Good eye  contact, . Memory intact  anxious and  depressed appearing.  CNS: CN 2-12 intact, power, tone and sensation normal throughout.       Assessment & Plan:

## 2011-10-13 NOTE — Assessment & Plan Note (Signed)
Excessive weight loss likely due to depression, however colonoscopy past due pt is being referred

## 2011-10-13 NOTE — Assessment & Plan Note (Signed)
Uncontrolled at this visit, however pt tearful, crying and upset, no med change

## 2011-10-14 ENCOUNTER — Ambulatory Visit (HOSPITAL_COMMUNITY): Payer: Medicare Other | Admitting: Psychiatry

## 2011-10-17 ENCOUNTER — Encounter: Payer: Self-pay | Admitting: Internal Medicine

## 2011-10-18 ENCOUNTER — Other Ambulatory Visit: Payer: Self-pay | Admitting: Family Medicine

## 2011-10-18 ENCOUNTER — Ambulatory Visit (INDEPENDENT_AMBULATORY_CARE_PROVIDER_SITE_OTHER): Payer: Medicare Other | Admitting: Urgent Care

## 2011-10-18 ENCOUNTER — Encounter: Payer: Self-pay | Admitting: Urgent Care

## 2011-10-18 DIAGNOSIS — R634 Abnormal weight loss: Secondary | ICD-10-CM

## 2011-10-18 DIAGNOSIS — R109 Unspecified abdominal pain: Secondary | ICD-10-CM | POA: Insufficient documentation

## 2011-10-18 DIAGNOSIS — Z800 Family history of malignant neoplasm of digestive organs: Secondary | ICD-10-CM | POA: Insufficient documentation

## 2011-10-18 NOTE — Progress Notes (Signed)
Primary Care Physician:  Syliva Overman, MD, MD Primary Gastroenterologist:  Dr. Darrick Penna  Chief Complaint  Patient presents with  . weight loss   HPI:  Lindsay Robertson is a 66 y.o. female here for evaluation of weight loss.  Describes Wt loss of 13# in last 3 mo.  She tells me she did lose her fiance, Theodosia Blender, due to massive MI.  Appetite ok.  Eating 3 meals/day & snacks like a sandwich between meals.  C/o mid-abd pain intermittent, lasted minutes, now resolved, started after death of fiance. Reports normal TSH through PCP.  Denies heartburn, indigestion, nausea or vomiting.   Denies any lower GI symptoms including constipation, diarrhea, rectal bleeding, or  Melena.  Dx w/ hypercalcemia, but pt never had PTH done as ordered by Dr Lodema Hong.  Previous workup for nausea and vomiting with weight loss 2008 as below.  Lab Summary Latest Ref Rng 08/29/2011  Hemoglobin 12.0 - 15.0 g/dL 16.1 (L)  Hematocrit 09.6 - 46.0 % 34.5 (L)  White count 4.0 - 10.5 K/uL 6.9  Platelet count 150 - 400 K/uL 266  Sodium 135 - 145 mEq/L 140  Potassium 3.5 - 5.3 mEq/L 4.5  Calcium 8.4 - 10.5 mg/dL 04.5 (H)  Phosphorus  (None)  Creatinine 0.50 - 1.10 mg/dL 4.09 (H)  AST 0 - 37 U/L (None)  Alk Phos 39 - 117 U/L (None)  Bilirubin 0.3 - 1.2 mg/dL (None)  Glucose 70 - 99 mg/dL 93  Cholesterol 0 - 811 mg/dL (None)  HDL cholesterol >39 mg/dL (None)  Triglycerides <914 mg/dL (None)  LDL Direct  (None)  LDL Calc 0 - 99 mg/dL (None)  Total protein 6.0 - 8.3 g/dL (None)  Albumin 3.5 - 5.2 g/dL (None)   Past Medical History  Diagnosis Date  . Hyperlipidemia   . Hypertension   . Allergy   . 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 Cataract extraction     right eye  . Tubal ligation   . Colonoscopy 02/19/2007     5 mm sigmoid colon polyp removed/Otherwise, no masses/  internal hemorrhoids  . 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 Colonoscopy 06/06/02    internal and external non-bleeding hemorrhoids/otherwise normal    Current Outpatient Prescriptions  Medication Sig Dispense Refill  . amLODipine (NORVASC) 10 MG tablet Take 1 tablet (10 mg total) by mouth daily.  30 tablet  11  . atorvastatin (LIPITOR) 80 MG tablet Take 80 mg by mouth at bedtime.       . calcium-vitamin D (OSCAL WITH D) 500-200 MG-UNIT per tablet Take 1 tablet by mouth daily.        . Choline Fenofibrate (TRILIPIX) 135 MG capsule Take 135 mg by mouth at bedtime.       . cloNIDine (CATAPRES) 0.2 MG tablet Take 1 tablet (0.2 mg total) by mouth at bedtime.  30 tablet  3  . clopidogrel (PLAVIX) 75 MG tablet Take 75 mg by mouth every morning.        . clotrimazole-betamethasone (LOTRISONE) cream Apply topically 2 (two) times daily.  45 g  2  . fluocinonide cream (LIDEX) 0.05 % Apply to affected area daily  30 g  1  . FLUoxetine (PROZAC) 10 MG capsule Take 1 capsule (10 mg total) by mouth daily.  30 capsule  2  . hydrOXYzine (ATARAX/VISTARIL) 10 MG tablet One at bedtime  30 tablet  3    Allergies as of 10/18/2011  . (No Known Allergies)    Family History  Problem Relation Age of Onset  . Breast cancer Mother   . Hypertension Mother   . Diabetes Father   . Hypertension Father   . Diabetes Sister   . Diabetes Brother   . Stroke Maternal Grandmother   . Hypertension Maternal Grandmother   . Diabetes Paternal Grandmother   . Colon cancer Brother 45    colon x 1  . Heart disease Brother     massive heart attack x 1  . Diabetes Brother     x1    History   Social History  . Marital Status: Widowed    Spouse Name: N/A    Number of Children: 1  . Years of Education: N/A   Occupational History  .     Social History Main Topics  . Smoking status: Never Smoker   . Smokeless tobacco: Not on file  . Alcohol Use: Yes  . Drug Use: No  . Sexually  Active: Not on file  Review of Systems: Gen: Denies any fever, chills, sweats, anorexia, fatigue, weakness, malaise, weight loss, and sleep disorder CV: Denies chest pain, angina, palpitations, syncope, orthopnea, PND, peripheral edema, and claudication. Resp: Denies dyspnea at rest, dyspnea with exercise, cough, sputum, wheezing, coughing up blood, and pleurisy. GI: Denies vomiting blood, jaundice, and fecal incontinence.   Denies dysphagia or odynophagia. GU : Denies urinary burning, blood in urine, urinary frequency, urinary hesitancy, nocturnal urination, and urinary incontinence. MS: Denies joint pain, limitation of movement, and swelling, stiffness, low back pain, extremity pain. Denies muscle weakness, cramps, atrophy.  Derm: Denies rash, itching, dry skin, hives, moles, warts, or unhealing ulcers.  Psych: Denies depression, anxiety, memory loss, suicidal ideation, hallucinations, paranoia, and confusion. Heme: Denies bruising, bleeding, and enlarged lymph nodes. Neuro:  Denies any headaches, dizziness, paresthesias. Endo:  Denies any problems with DM, thyroid, adrenal function.  Physical Exam: BP 141/75  Pulse 92  Temp(Src) 98.2 F (36.8 C) (Temporal)  Ht 5\' 5"  (1.651 m)  Wt 142 lb 9.6 oz (64.683 kg)  BMI 23.73 kg/m2 General:   Alert,  Well-developed, well-nourished, pleasant and cooperative in NAD Head:  Normocephalic and atraumatic. Eyes:  Sclera clear, no icterus.   Conjunctiva pink. Ears:  Normal auditory acuity. Nose:  No deformity, discharge, or lesions. Mouth:  No deformity or lesions,oropharynx pink & moist. Neck:  Supple; no masses or thyromegaly. Lungs:  Clear throughout to auscultation.   No wheezes, crackles, or rhonchi. No acute distress. Heart:  Regular rate and rhythm; no murmurs, clicks, rubs,  or gallops. Abdomen:  Normal bowel sounds.  No bruits.  Soft, non-tender and non-distended without masses, hepatosplenomegaly or hernias noted.  No guarding or rebound  tenderness.   Rectal:  Deferred. Msk:  Symmetrical without gross deformities. Normal posture. Pulses:  Normal pulses noted. Extremities:  No clubbing or edema. Neurologic:  Alert and  oriented x4;  grossly normal neurologically. Skin:  Intact without significant lesions or rashes. Lymph Nodes:  No significant cervical adenopathy. Psych:  Alert and cooperative. Normal mood and affect.

## 2011-10-18 NOTE — Assessment & Plan Note (Signed)
No significant abdominal pain at this time.

## 2011-10-18 NOTE — Assessment & Plan Note (Addendum)
Lindsay Robertson is a pleasant 66 y.o. female with unintentional 13 pound weight loss in the past 3 months.  She has suffered a significant loss due to her fiance's death, however she tells me she is eating regular meals and denies any GI symptoms. Her hypercalcemia is concerning, and she will need further workup for this to rule out malignancy. We will check lab work today.  Given her family history of colon cancer, and the fact that her colonoscopy is due this year for high-risk screening, will consider colonoscopy and possible EGD to look for malignancy, peptic ulcer disease, for further evaluation of unintentional weight loss pending lab results. She is on Plavix.  CBC, CMP Lab work ordered by Dr. Lodema Hong including PTH

## 2011-10-18 NOTE — Patient Instructions (Signed)
Please be sure to get your labs today I will call with results & set up colonoscopy and possible EGD for your weight loss once reviewed

## 2011-10-19 LAB — CBC WITH DIFFERENTIAL/PLATELET
Basophils Absolute: 0 10*3/uL (ref 0.0–0.1)
Basophils Relative: 0 % (ref 0–1)
Eosinophils Relative: 1 % (ref 0–5)
HCT: 35.1 % — ABNORMAL LOW (ref 36.0–46.0)
Hemoglobin: 11.4 g/dL — ABNORMAL LOW (ref 12.0–15.0)
Lymphocytes Relative: 29 % (ref 12–46)
MCHC: 32.5 g/dL (ref 30.0–36.0)
MCV: 80.5 fL (ref 78.0–100.0)
Monocytes Absolute: 0.6 10*3/uL (ref 0.1–1.0)
Monocytes Relative: 10 % (ref 3–12)
RDW: 15.9 % — ABNORMAL HIGH (ref 11.5–15.5)

## 2011-10-19 LAB — COMPREHENSIVE METABOLIC PANEL
AST: 26 U/L (ref 0–37)
Albumin: 4.4 g/dL (ref 3.5–5.2)
BUN: 14 mg/dL (ref 6–23)
Calcium: 9.6 mg/dL (ref 8.4–10.5)
Chloride: 102 mEq/L (ref 96–112)
Creat: 1.09 mg/dL (ref 0.50–1.10)
Glucose, Bld: 90 mg/dL (ref 70–99)

## 2011-10-21 ENCOUNTER — Other Ambulatory Visit: Payer: Self-pay | Admitting: Gastroenterology

## 2011-10-21 DIAGNOSIS — R634 Abnormal weight loss: Secondary | ICD-10-CM

## 2011-10-21 LAB — PTH, INTACT AND CALCIUM: Calcium, Total (PTH): 9.6 mg/dL (ref 8.4–10.5)

## 2011-10-21 NOTE — Progress Notes (Signed)
Faxed to PCP

## 2011-10-21 NOTE — Progress Notes (Signed)
PT NEEDS TO COMPLETE WORKUP FOR ELEVATED CALCIUM. SHE SHOULD HAVE A CT A/P TO EVALUATE FOR WEIGHT LOSS/ABDOMINAL PAIN/HYPERCALCEMIA/OCCULT MALIGNANCY. SCHEDULE TCS/EGD AFTER CT COMPLETE.  WEIGHT LOSS MOST LIKELY DUE TO DEPRESSION/ANXIETY. PT WEIGHED 146 LBS IN  JUL 2008.

## 2011-10-21 NOTE — Progress Notes (Signed)
Quick Note:  Please call patient. She has a mildly low hemoglobin. Her electrolytes including calcium and a PTH are normal now. Dr. Darrick Penna would like her to have a CT scan of the abdomen and pelvis with IV and oral contrast to further evaluate for her weight loss. Please arrange. CC: Syliva Overman, MD   ______

## 2011-10-23 ENCOUNTER — Ambulatory Visit (HOSPITAL_COMMUNITY)
Admission: RE | Admit: 2011-10-23 | Discharge: 2011-10-23 | Disposition: A | Discharge status: Home / Self Care | Payer: Medicare Other | Source: Ambulatory Visit | Attending: Urgent Care | Admitting: Urgent Care

## 2011-10-23 ENCOUNTER — Encounter (HOSPITAL_COMMUNITY): Payer: Self-pay

## 2011-10-23 DIAGNOSIS — R634 Abnormal weight loss: Secondary | ICD-10-CM | POA: Insufficient documentation

## 2011-10-23 MED ORDER — IOHEXOL 300 MG/ML  SOLN
100.0000 mL | Freq: Once | INTRAMUSCULAR | Status: AC | PRN
  Administered 2011-10-23: 100 mL via INTRAVENOUS

## 2011-10-23 NOTE — Progress Notes (Signed)
Quick Note:  Please call pt w/ results Thanks ______

## 2011-10-23 NOTE — Progress Notes (Signed)
Quick Note:  Please call patient. Her CT looks fine. Her left kidney is somewhat smaller than normal, but this has been stable. There is some stool in her colon possibly constipation. Begin MiraLax 17 g daily. She needs colonoscopy and EGD with Dr. Darrick Penna reason: Unintentional weight loss Please arrange.  [id number] Lodema Hong, MD  ______

## 2011-10-24 ENCOUNTER — Other Ambulatory Visit: Payer: Self-pay | Admitting: Gastroenterology

## 2011-10-24 DIAGNOSIS — IMO0001 Reserved for inherently not codable concepts without codable children: Secondary | ICD-10-CM

## 2011-10-24 MED ORDER — PEG 3350-KCL-NA BICARB-NACL 420 G PO SOLR: ORAL | Status: AC

## 2011-10-24 NOTE — Progress Notes (Signed)
Quick Note:  Called and informed pt. She is aware colonoscopy and EGD to be scheduled. ______

## 2011-11-04 ENCOUNTER — Encounter: Payer: Self-pay | Admitting: Family Medicine

## 2011-11-07 ENCOUNTER — Encounter (HOSPITAL_COMMUNITY): Payer: Self-pay

## 2011-11-13 ENCOUNTER — Encounter (HOSPITAL_COMMUNITY): Payer: Self-pay | Admitting: *Deleted

## 2011-11-13 ENCOUNTER — Encounter (HOSPITAL_COMMUNITY)
Admission: RE | Disposition: A | Discharge status: Home / Self Care | Payer: Self-pay | Source: Ambulatory Visit | Attending: Gastroenterology

## 2011-11-13 ENCOUNTER — Ambulatory Visit (HOSPITAL_COMMUNITY)
Admission: RE | Admit: 2011-11-13 | Discharge: 2011-11-13 | Disposition: A | Discharge status: Home / Self Care | Payer: Medicare Other | Source: Ambulatory Visit | Attending: Gastroenterology | Admitting: Gastroenterology

## 2011-11-13 DIAGNOSIS — K319 Disease of stomach and duodenum, unspecified: Secondary | ICD-10-CM | POA: Insufficient documentation

## 2011-11-13 DIAGNOSIS — R630 Anorexia: Secondary | ICD-10-CM

## 2011-11-13 DIAGNOSIS — R634 Abnormal weight loss: Secondary | ICD-10-CM | POA: Insufficient documentation

## 2011-11-13 DIAGNOSIS — K5730 Diverticulosis of large intestine without perforation or abscess without bleeding: Secondary | ICD-10-CM | POA: Insufficient documentation

## 2011-11-13 DIAGNOSIS — Q391 Atresia of esophagus with tracheo-esophageal fistula: Secondary | ICD-10-CM | POA: Insufficient documentation

## 2011-11-13 DIAGNOSIS — K648 Other hemorrhoids: Secondary | ICD-10-CM | POA: Insufficient documentation

## 2011-11-13 DIAGNOSIS — K2970 Gastritis, unspecified, without bleeding: Secondary | ICD-10-CM | POA: Insufficient documentation

## 2011-11-13 DIAGNOSIS — D126 Benign neoplasm of colon, unspecified: Secondary | ICD-10-CM

## 2011-11-13 DIAGNOSIS — K2990 Gastroduodenitis, unspecified, without bleeding: Secondary | ICD-10-CM

## 2011-11-13 DIAGNOSIS — K449 Diaphragmatic hernia without obstruction or gangrene: Secondary | ICD-10-CM | POA: Insufficient documentation

## 2011-11-13 DIAGNOSIS — Z79899 Other long term (current) drug therapy: Secondary | ICD-10-CM | POA: Insufficient documentation

## 2011-11-13 DIAGNOSIS — Z800 Family history of malignant neoplasm of digestive organs: Secondary | ICD-10-CM

## 2011-11-13 DIAGNOSIS — IMO0001 Reserved for inherently not codable concepts without codable children: Secondary | ICD-10-CM

## 2011-11-13 HISTORY — DX: Major depressive disorder, single episode, unspecified: F32.9

## 2011-11-13 HISTORY — PX: ESOPHAGOGASTRODUODENOSCOPY: SHX1529

## 2011-11-13 HISTORY — DX: Depression, unspecified: F32.A

## 2011-11-13 SURGERY — COLONOSCOPY WITH ESOPHAGOGASTRODUODENOSCOPY (EGD)
Anesthesia: Moderate Sedation

## 2011-11-13 MED ORDER — PANTOPRAZOLE SODIUM 40 MG PO TBEC: DELAYED_RELEASE_TABLET | ORAL | Status: DC

## 2011-11-13 MED ORDER — STERILE WATER FOR IRRIGATION IR SOLN
Status: DC | PRN
  Administered 2011-11-13: 08:00:00

## 2011-11-13 MED ORDER — PROMETHAZINE HCL 25 MG/ML IJ SOLN
INTRAMUSCULAR | Status: AC
  Filled 2011-11-13: qty 1

## 2011-11-13 MED ORDER — MEPERIDINE HCL 100 MG/ML IJ SOLN
INTRAMUSCULAR | Status: DC | PRN
  Administered 2011-11-13 (×2): 25 mg via INTRAVENOUS

## 2011-11-13 MED ORDER — MEPERIDINE HCL 100 MG/ML IJ SOLN
INTRAMUSCULAR | Status: AC
  Filled 2011-11-13: qty 1

## 2011-11-13 MED ORDER — SODIUM CHLORIDE 0.45 % IV SOLN
Freq: Once | INTRAVENOUS | Status: AC
  Administered 2011-11-13: 1000 mL via INTRAVENOUS

## 2011-11-13 MED ORDER — SODIUM CHLORIDE 0.9 % IJ SOLN
INTRAMUSCULAR | Status: AC
  Filled 2011-11-13: qty 10

## 2011-11-13 MED ORDER — MIDAZOLAM HCL 5 MG/5ML IJ SOLN
INTRAMUSCULAR | Status: DC | PRN
  Administered 2011-11-13: 1 mg via INTRAVENOUS
  Administered 2011-11-13: 2 mg via INTRAVENOUS
  Administered 2011-11-13: 1 mg via INTRAVENOUS
  Administered 2011-11-13: 2 mg via INTRAVENOUS

## 2011-11-13 MED ORDER — MIDAZOLAM HCL 5 MG/5ML IJ SOLN
INTRAMUSCULAR | Status: AC
  Filled 2011-11-13: qty 10

## 2011-11-13 NOTE — Op Note (Signed)
[facility] [address] [place], [place]  [address]  DR. FIELD'S TCS EGD PROCEDURE REPORT  PATIENT:  Lindsay Robertson, Lindsay Robertson  MR#:  [id number] BIRTHDATE:  [birth year], 65 yrs. old  GENDER:  female  ENDOSCOPIST:  Jonette Eva, MD REF. BY: Syliva Overman, M.D. ASSISTANT:  PROCEDURE DATE:  2020/12/411 PROCEDURE:  Colonoscopy with biopsy and snare polypectomy, EGD WITH BIOPSY AND PYLORIC DILATION  INDICATIONS:  anorexia, weight loss brother died of colon ca. sister has polyps. LAST TCS/EGD JULY 2008: SIMPLE ADENOMAS, ? CERVICAL ESOPHAGUS  MEDICATIONS:   Demerol 50 mg IV, Versed 6 mg IV  DESCRIPTION OF PROCEDURE:    Physical exam was performed. Informed consent was obtained from the patient after explaining the benefits, risks, and alternatives to procedure.  The patient was connected to monitor and placed in left lateral position. Continuous oxygen was provided by nasal cannula and IV medicine administered through an indwelling cannula.  After administration of sedation, the patient's esophagus was intubated and the EC-[id number] ([id number]) and EG-[id number] ([id number]) endoscope was advanced under direct visualization to the second portion of the duodenum. The scope was removed slowly by carefully examining the color, texture, anatomy, and integrity of the mucosa on the way out.  After administration of sedation and rectal exam, the patient's rectum was intubated and the EC-[id number] ([id number]) and EG-[id number] ([id number]) colonoscope was advanced under direct visualization to the cecum.  The scope was removed slowly by carefully examining the color, texture, anatomy, and integrity mucosa on the way out. The patient was recovered in endoscopy and discharged home in satisfactory condition. <<PROCEDUREIMAGES>>  FINDINGS:  NL ILEUM  10 CM VISUALIZED. There were multiple polyps identified and removed FROM THE CECUM to sigmoid colon VIA COLD FORCEPS/SNARE CAUTERY[ 6 MM CEC, 3 MM, AC, 6 MM HF, (2)  TC)] There were mild diverticular changes in left colon. SMALL Internal Hemorrhoids were found. PATENT SCHATZKI'S RING.  1-2 CM HIATAL HERNIA. MILD GASRITIS BIOPSIED VIA COLD FORCEPS. NL DUODENUM. BIOPSIES OBTAINED VIA COLD FORCEPS TO EVALUATE FOR CELIAC SPRUE. DIFFICULT TO PASS DIAGNOSTIC SCOPE THROUGH PYLORUS. PYLORUS DIALTED WITH THE TTS BALLOON FROM 11 TO 12 MM. EACH SETTING HELD FOR 1 MINUTE.  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  n. eSIGNED:     at 2020/12/411 03:04 PM  Page 3 of 3   Renford Dills, [phone]

## 2011-11-13 NOTE — Discharge Instructions (Signed)
You had 5 small polyps removed. You have internal hemorrhoids and diverticulosis IN YOUR LEFT COLON. I dilated your PYLORUS TO ADDRESS YOUR GETTING FULL FAST. You have A RING AT THE BASE OF YOUR ESOPHAGUS,A HIATAL HERNIA, & gastritis. I biopsied your stomach & SMALL BOWEL.  GO HOME AND TAKE YOUR BLOOD PRESSURE MEDICINE. START PROTONIX EVERY MORNING. AVOID ASPIRIN, IBUPROFEN, NAPROXEN, BC, &GOODY POWDERS. USE TYLENOL AS NEEDED FOR PAIN. Next colonoscopy in 5 years. FOLLOW A HIGH FIBER DIET. AVOID ITEMS THAT CAUSE BLOATING. SEE INFO BELOW. YOUR BIOPSY RESULTS SHOULD BE BACK IN 7 DAYS. FOLLOW UP IN 1 MONTH.  ENDOSCOPY Care After Read the instructions outlined below and refer to this sheet in the next week. These discharge instructions provide you with general information on caring for yourself after you leave the hospital. While your treatment has been planned according to the most current medical practices available, unavoidable complications occasionally occur. If you have any problems or questions after discharge, call DR. Alyshia Kernan, [phone].  ACTIVITY  You may resume your regular activity, but move at a slower pace for the next 24 hours.   Take frequent rest periods for the next 24 hours.   Walking will help get rid of the air and reduce the bloated feeling in your belly (abdomen).   No driving for 24 hours (because of the medicine (anesthesia) used during the test).   You may shower.   Do not sign any important legal documents or operate any machinery for 24 hours (because of the anesthesia used during the test).    NUTRITION  Drink plenty of fluids.   You may resume your normal diet as instructed by your doctor.   Begin with a light meal and progress to your normal diet. Heavy or fried foods are harder to digest and may make you feel sick to your stomach (nauseated).   Avoid alcoholic beverages for 24 hours or as instructed.    MEDICATIONS  You may resume your normal  medications.   WHAT YOU CAN EXPECT TODAY  Some feelings of bloating in the abdomen.   Passage of more gas than usual.   Spotting of blood in your stool or on the toilet paper  .  IF YOU HAD POLYPS REMOVED DURING THE ENDOSCOPY:  Eat a soft diet IF YOU HAVE NAUSEA, BLOATING, ABDOMINAL PAIN, OR VOMITING.    FINDING OUT THE RESULTS OF YOUR TEST Not all test results are available during your visit. DR. Darrick Penna WILL CALL YOU WITHIN 7 DAYS OF YOUR PROCEDUE WITH YOUR RESULTS. Do not assume everything is normal if you have not heard from DR. Shelia Kingsberry IN ONE WEEK, CALL HER OFFICE AT [phone].  SEEK IMMEDIATE MEDICAL ATTENTION AND CALL THE OFFICE: [phone] IF:  You have more than a spotting of blood in your stool.   Your belly is swollen (abdominal distention).   You are nauseated or vomiting.   You have a temperature over 101F.   You have abdominal pain or discomfort that is severe or gets worse throughout the day.   Gastritis  Gastritis is an inflammation (the body's way of reacting to injury and/or infection) of the stomach. It is often caused by viral or bacterial (germ) infections. It can also be caused BY ASPIRIN, BC/GOODY POWDER'S, (IBUPROFEN) MOTRIN, OR ALEVE (NAPROXEN), chemicals (including alcohol), SPICY FOODS, and medications. This illness may be associated with generalized malaise (feeling tired, not well), UPPER ABDOMINAL STOMACH cramps, and fever. One common bacterial cause of gastritis is an organism known  as H. Pylori. This can be treated with antibiotics.   Hiatal Hernia A hiatal hernia occurs when a part of the stomach slides above the diaphragm. The diaphragm is the thin muscle separating the belly (abdomen) from the chest. A hiatal hernia can be something you are born with or develop over time. Hiatal hernias may allow stomach acid to flow back into your esophagus, the tube which carries food from your mouth to your stomach. If this acid causes problems it is  called GERD (gastro-esophageal reflux disease).   SYMPTOMS Common symptoms of GERD are heartburn (burning in your chest). This is worse when lying down or bending over. It may also cause belching and indigestion. Some of the things which make GERD worse are:  Increased weight pushes on stomach making acid rise more easily.   Smoking markedly increases acid production.   Alcohol decreases lower esophageal sphincter pressure (valve between stomach and esophagus), allowing acid from stomach into esophagus.   Late evening meals and going to bed with a full stomach increases pressure.   Anything that causes an increase in acid production.    HOME CARE INSTRUCTIONS  Try to achieve and maintain an ideal body weight.   Avoid drinking alcoholic beverages.   DO NOT smokE.   Do not wear tight clothing around your chest or stomach.   Eat smaller meals and eat more frequently. This keeps your stomach from getting too full. Eat slowly.   Do not lie down for 2 or 3 hours after eating. Do not eat or drink anything 1 to 2 hours before going to bed.   Avoid caffeine beverages (colas, coffee, cocoa, tea), fatty foods, citrus fruits and all other foods and drinks that contain acid and that seem to increase the problems.   Avoid bending over, especially after eating OR STRAINING. Anything that increases the pressure in your belly increases the amount of acid that may be pushed up into your esophagus.   Polyps, Colon  A polyp is extra tissue that grows inside your body. Colon polyps grow in the large intestine. The large intestine, also called the colon, is part of your digestive system. It is a long, hollow tube at the end of your digestive tract where your body makes and stores stool. Most polyps are not dangerous. They are benign. This means they are not cancerous. But over time, some types of polyps can turn into cancer. Polyps that are smaller than a pea are usually not harmful. But larger polyps  could someday become or may already be cancerous. To be safe, doctors remove all polyps and test them.   WHO GETS POLYPS? Anyone can get polyps, but certain people are more likely than others. You may have a greater chance of getting polyps if:  You are over 50.   You have had polyps before.   Someone in your family has had polyps.   Someone in your family has had cancer of the large intestine.   Find out if someone in your family has had polyps. You may also be more likely to get polyps if you:   Eat a lot of fatty foods   Smoke   Drink alcohol   Do not exercise  Eat too much   TREATMENT  The caregiver will remove the polyp during sigmoidoscopy or colonoscopy.  PREVENTION There is not one sure way to prevent polyps. You might be able to lower your risk of getting them if you:  Eat more fruits and vegetables  and less fatty food.   Do not smoke.   Avoid alcohol.   Exercise every day.   Lose weight if you are overweight.   Eating more calcium and folate can also lower your risk of getting polyps. Some foods that are rich in calcium are milk, cheese, and broccoli. Some foods that are rich in folate are chickpeas, kidney beans, and spinach.   High-Fiber Diet A high-fiber diet changes your normal diet to include more whole grains, legumes, fruits, and vegetables. Changes in the diet involve replacing refined carbohydrates with unrefined foods. The calorie level of the diet is essentially unchanged. The Dietary Reference Intake (recommended amount) for adult males is 38 grams per day. For adult females, it is 25 grams per day. Pregnant and lactating women should consume 28 grams of fiber per day. Fiber is the intact part of a plant that is not broken down during digestion. Functional fiber is fiber that has been isolated from the plant to provide a beneficial effect in the body. PURPOSE  Increase stool bulk.   Ease and regulate bowel movements.   Lower cholesterol.    INDICATIONS THAT YOU NEED MORE FIBER  Constipation and hemorrhoids.   Uncomplicated diverticulosis (intestine condition) and irritable bowel syndrome.   Weight management.   As a protective measure against hardening of the arteries (atherosclerosis), diabetes, and cancer.   GUIDELINES FOR INCREASING FIBER IN THE DIET  Start adding fiber to the diet slowly. A gradual increase of about 5 more grams (2 slices of whole-wheat bread, 2 servings of most fruits or vegetables, or 1 bowl of high-fiber cereal) per day is best. Too rapid an increase in fiber may result in constipation, flatulence, and bloating.   Drink enough water and fluids to keep your urine clear or pale yellow. Water, juice, or caffeine-free drinks are recommended. Not drinking enough fluid may cause constipation.   Eat a variety of high-fiber foods rather than one type of fiber.   Try to increase your intake of fiber through using high-fiber foods rather than fiber pills or supplements that contain small amounts of fiber.   The goal is to change the types of food eaten. Do not supplement your present diet with high-fiber foods, but replace foods in your present diet.  INCLUDE A VARIETY OF FIBER SOURCES  Replace refined and processed grains with whole grains, canned fruits with fresh fruits, and incorporate other fiber sources. White rice, white breads, and most bakery goods contain little or no fiber.   Brown whole-grain rice, buckwheat oats, and many fruits and vegetables are all good sources of fiber. These include: broccoli, Brussels sprouts, cabbage, cauliflower, beets, sweet potatoes, white potatoes (skin on), carrots, tomatoes, eggplant, squash, berries, fresh fruits, and dried fruits.   Cereals appear to be the richest source of fiber. Cereal fiber is found in whole grains and bran. Bran is the fiber-rich outer coat of cereal grain, which is largely removed in refining. In whole-grain cereals, the bran remains. In  breakfast cereals, the largest amount of fiber is found in those with "bran" in their names. The fiber content is sometimes indicated on the label.   You may need to include additional fruits and vegetables each day.   In baking, for 1 cup white flour, you may use the following substitutions:   1 cup whole-wheat flour minus 2 tablespoons.   1/2 cup white flour plus 1/2 cup whole-wheat flour.   Diverticulosis Diverticulosis is a common condition that develops when small pouches (diverticula)  form in the wall of the colon. The risk of diverticulosis increases with age. It happens more often in people who eat a low-fiber diet. Most individuals with diverticulosis have no symptoms. Those individuals with symptoms usually experience belly (abdominal) pain, constipation, or loose stools (diarrhea).  HOME CARE INSTRUCTIONS  Increase the amount of fiber in your diet as directed by your caregiver or dietician. This may reduce symptoms of diverticulosis.   Drink at least 6 to 8 glasses of water each day to prevent constipation.   Try not to strain when you have a bowel movement.   Avoiding nuts and seeds to prevent complications is still an uncertain benefit.   FOODS HAVING HIGH FIBER CONTENT INCLUDE:  Fruits. Apple, peach, pear, tangerine, raisins, prunes.   Vegetables. Brussels sprouts, asparagus, broccoli, cabbage, carrot, cauliflower, romaine lettuce, spinach, summer squash, tomato, winter squash, zucchini.   Starchy Vegetables. Baked beans, kidney beans, lima beans, split peas, lentils, potatoes (with skin).   Grains. Whole wheat bread, brown rice, bran flake cereal, plain oatmeal, white rice, shredded wheat, bran muffins.   SEEK IMMEDIATE MEDICAL CARE IF:  You develop increasing pain or severe bloating.   You have an oral temperature above 101F.   You develop vomiting or bowel movements that are bloody or black.   Hemorrhoids Hemorrhoids are dilated (enlarged) veins around the  rectum. Sometimes clots will form in the veins. This makes them swollen and painful. These are called thrombosed hemorrhoids. Causes of hemorrhoids include:  Constipation.   Straining to have a bowel movement.   HEAVY LIFTING HOME CARE INSTRUCTIONS  Eat a well balanced diet and drink 6 to 8 glasses of water every day to avoid constipation. You may also use a bulk laxative.   Avoid straining to have bowel movements.   Keep anal area dry and clean.   Do not use a donut shaped pillow or sit on the toilet for long periods. This increases blood pooling and pain.   Move your bowels when your body has the urge; this will require less straining and will decrease pain and pressure.

## 2011-11-13 NOTE — Interval H&P Note (Signed)
History and Physical Interval Note:  11/13/2011 8:37 AM  Lindsay Robertson  has presented today for surgery, with the diagnosis of wt loss  The various methods of treatment have been discussed with the patient and family. After consideration of risks, benefits and other options for treatment, the patient has consented to  Procedure(s) (LRB): COLONOSCOPY WITH ESOPHAGOGASTRODUODENOSCOPY (EGD) (N/A) as a surgical intervention .  The patients' history has been reviewed, patient examined, no change in status, stable for surgery.  I have reviewed the patients' chart and labs.  Questions were answered to the patient's satisfaction.     [employer]

## 2011-11-13 NOTE — H&P (View-Only) (Signed)
Primary Care Physician:  Syliva Overman, MD, MD Primary Gastroenterologist:  Dr. Darrick Penna  Chief Complaint  Patient presents with  . weight loss   HPI:  Lindsay Robertson is a 66 y.o. female here for evaluation of weight loss.  Describes Wt loss of 13# in last 3 mo.  She tells me she did lose her fiance, Theodosia Blender, due to massive MI.  Appetite ok.  Eating 3 meals/day & snacks like a sandwich between meals.  C/o mid-abd pain intermittent, lasted minutes, now resolved, started after death of fiance. Reports normal TSH through PCP.  Denies heartburn, indigestion, nausea or vomiting.   Denies any lower GI symptoms including constipation, diarrhea, rectal bleeding, or  Melena.  Dx w/ hypercalcemia, but pt never had PTH done as ordered by Dr Lodema Hong.  Previous workup for nausea and vomiting with weight loss 2008 as below.  Lab Summary Latest Ref Rng 08/29/2011  Hemoglobin 12.0 - 15.0 g/dL 16.1 (L)  Hematocrit 09.6 - 46.0 % 34.5 (L)  White count 4.0 - 10.5 K/uL 6.9  Platelet count 150 - 400 K/uL 266  Sodium 135 - 145 mEq/L 140  Potassium 3.5 - 5.3 mEq/L 4.5  Calcium 8.4 - 10.5 mg/dL 04.5 (H)  Phosphorus  (None)  Creatinine 0.50 - 1.10 mg/dL 4.09 (H)  AST 0 - 37 U/L (None)  Alk Phos 39 - 117 U/L (None)  Bilirubin 0.3 - 1.2 mg/dL (None)  Glucose 70 - 99 mg/dL 93  Cholesterol 0 - 811 mg/dL (None)  HDL cholesterol >39 mg/dL (None)  Triglycerides <914 mg/dL (None)  LDL Direct  (None)  LDL Calc 0 - 99 mg/dL (None)  Total protein 6.0 - 8.3 g/dL (None)  Albumin 3.5 - 5.2 g/dL (None)   Past Medical History  Diagnosis Date  . Hyperlipidemia   . Hypertension   . Allergy   . 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 Cataract extraction     right eye  . Tubal ligation   . Colonoscopy 02/19/2007     5 mm sigmoid colon polyp removed/Otherwise, no masses/  internal hemorrhoids  . 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 Colonoscopy 06/06/02    internal and external non-bleeding hemorrhoids/otherwise normal    Current Outpatient Prescriptions  Medication Sig Dispense Refill  . amLODipine (NORVASC) 10 MG tablet Take 1 tablet (10 mg total) by mouth daily.  30 tablet  11  . atorvastatin (LIPITOR) 80 MG tablet Take 80 mg by mouth at bedtime.       . calcium-vitamin D (OSCAL WITH D) 500-200 MG-UNIT per tablet Take 1 tablet by mouth daily.        . Choline Fenofibrate (TRILIPIX) 135 MG capsule Take 135 mg by mouth at bedtime.       . cloNIDine (CATAPRES) 0.2 MG tablet Take 1 tablet (0.2 mg total) by mouth at bedtime.  30 tablet  3  . clopidogrel (PLAVIX) 75 MG tablet Take 75 mg by mouth every morning.        . clotrimazole-betamethasone (LOTRISONE) cream Apply topically 2 (two) times daily.  45 g  2  . fluocinonide cream (LIDEX) 0.05 % Apply to affected area daily  30 g  1  . FLUoxetine (PROZAC) 10 MG capsule Take 1 capsule (10 mg total) by mouth daily.  30 capsule  2  . hydrOXYzine (ATARAX/VISTARIL) 10 MG tablet One at bedtime  30 tablet  3    Allergies as of 10/18/2011  . (No Known Allergies)    Family History  Problem Relation Age of Onset  . Breast cancer Mother   . Hypertension Mother   . Diabetes Father   . Hypertension Father   . Diabetes Sister   . Diabetes Brother   . Stroke Maternal Grandmother   . Hypertension Maternal Grandmother   . Diabetes Paternal Grandmother   . Colon cancer Brother 12    colon x 1  . Heart disease Brother     massive heart attack x 1  . Diabetes Brother     x1    History   Social History  . Marital Status: Widowed    Spouse Name: N/A    Number of Children: 1  . Years of Education: N/A   Occupational History  .     Social History Main Topics  . Smoking status: Never Smoker   . Smokeless tobacco: Not on file  . Alcohol Use: Yes  . Drug Use: No  . Sexually  Active: Not on file  Review of Systems: Gen: Denies any fever, chills, sweats, anorexia, fatigue, weakness, malaise, weight loss, and sleep disorder CV: Denies chest pain, angina, palpitations, syncope, orthopnea, PND, peripheral edema, and claudication. Resp: Denies dyspnea at rest, dyspnea with exercise, cough, sputum, wheezing, coughing up blood, and pleurisy. GI: Denies vomiting blood, jaundice, and fecal incontinence.   Denies dysphagia or odynophagia. GU : Denies urinary burning, blood in urine, urinary frequency, urinary hesitancy, nocturnal urination, and urinary incontinence. MS: Denies joint pain, limitation of movement, and swelling, stiffness, low back pain, extremity pain. Denies muscle weakness, cramps, atrophy.  Derm: Denies rash, itching, dry skin, hives, moles, warts, or unhealing ulcers.  Psych: Denies depression, anxiety, memory loss, suicidal ideation, hallucinations, paranoia, and confusion. Heme: Denies bruising, bleeding, and enlarged lymph nodes. Neuro:  Denies any headaches, dizziness, paresthesias. Endo:  Denies any problems with DM, thyroid, adrenal function.  Physical Exam: BP 141/75  Pulse 92  Temp(Src) 98.2 F (36.8 C) (Temporal)  Ht 5\' 5"  (1.651 m)  Wt 142 lb 9.6 oz (64.683 kg)  BMI 23.73 kg/m2 General:   Alert,  Well-developed, well-nourished, pleasant and cooperative in NAD Head:  Normocephalic and atraumatic. Eyes:  Sclera clear, no icterus.   Conjunctiva pink. Ears:  Normal auditory acuity. Nose:  No deformity, discharge, or lesions. Mouth:  No deformity or lesions,oropharynx pink & moist. Neck:  Supple; no masses or thyromegaly. Lungs:  Clear throughout to auscultation.   No wheezes, crackles, or rhonchi. No acute distress. Heart:  Regular rate and rhythm; no murmurs, clicks, rubs,  or gallops. Abdomen:  Normal bowel sounds.  No bruits.  Soft, non-tender and non-distended without masses, hepatosplenomegaly or hernias noted.  No guarding or rebound  tenderness.   Rectal:  Deferred. Msk:  Symmetrical without gross deformities. Normal posture. Pulses:  Normal pulses noted. Extremities:  No clubbing or edema. Neurologic:  Alert and  oriented x4;  grossly normal neurologically. Skin:  Intact without significant lesions or rashes. Lymph Nodes:  No significant cervical adenopathy. Psych:  Alert and cooperative. Normal mood and affect.

## 2011-11-20 ENCOUNTER — Ambulatory Visit: Payer: Medicare Other | Admitting: Family Medicine

## 2011-11-24 ENCOUNTER — Telehealth: Payer: Self-pay | Admitting: Gastroenterology

## 2011-11-24 NOTE — Telephone Encounter (Signed)
Please call pt. She had simple adenomas removed from her colon. HER stomach Bx shows gastritis & DUODENITIS DUE YOUR USING ASPIRIN PRODUCTS. Continue PROTONIX 30 MINUTES PRIOR TO MEALS. OPV IN MAY 2013. FOLLOW A HIGH FIBER DIET. TCS IN 5 YEARS.

## 2011-11-25 ENCOUNTER — Ambulatory Visit (INDEPENDENT_AMBULATORY_CARE_PROVIDER_SITE_OTHER): Payer: Medicare Other | Admitting: Family Medicine

## 2011-11-25 ENCOUNTER — Encounter: Payer: Self-pay | Admitting: Family Medicine

## 2011-11-25 VITALS — BP 158/80 | HR 90 | Resp 18 | Ht 65.0 in | Wt 141.0 lb

## 2011-11-25 DIAGNOSIS — G4700 Insomnia, unspecified: Secondary | ICD-10-CM

## 2011-11-25 DIAGNOSIS — F329 Major depressive disorder, single episode, unspecified: Secondary | ICD-10-CM

## 2011-11-25 DIAGNOSIS — I10 Essential (primary) hypertension: Secondary | ICD-10-CM

## 2011-11-25 DIAGNOSIS — F3289 Other specified depressive episodes: Secondary | ICD-10-CM

## 2011-11-25 DIAGNOSIS — E785 Hyperlipidemia, unspecified: Secondary | ICD-10-CM

## 2011-11-25 DIAGNOSIS — I6350 Cerebral infarction due to unspecified occlusion or stenosis of unspecified cerebral artery: Secondary | ICD-10-CM

## 2011-11-25 DIAGNOSIS — I639 Cerebral infarction, unspecified: Secondary | ICD-10-CM

## 2011-11-25 MED ORDER — TRIAMTERENE-HCTZ 37.5-25 MG PO TABS: 1.0000 | ORAL_TABLET | Freq: Every day | ORAL | Status: AC

## 2011-11-25 NOTE — Assessment & Plan Note (Signed)
Improved but still uncontrolled, add maxzide

## 2011-11-25 NOTE — Telephone Encounter (Signed)
Results Cc to PCP & reminder is in the computer 

## 2011-11-25 NOTE — Patient Instructions (Signed)
F/U in early June  Blood pressure is improved but still not  At goal, additional medication to be started, triamterene   I am happy that you are much better, continue current medications  Non fasting chem 7 in June about 3 days before follow up

## 2011-11-25 NOTE — Telephone Encounter (Signed)
Pt informed

## 2011-11-25 NOTE — Progress Notes (Signed)
  Subjective:    Patient ID: Lindsay Robertson, female    DOB: 1946/07/25, 66 y.o.   MRN: [id number]  HPI The PT is here for follow up and re-evaluation of chronic medical conditions, medication management and review of any available recent lab and radiology data.  Preventive health is updated, specifically  Cancer screening and Immunization.   Questions or concerns regarding consultations or procedures which the PT has had in the interim are  addressed. The PT denies any adverse reactions to current medications since the last visit.  There are no new concerns.  There are no specific complaints       Review of Systems See HPI Denies recent fever or chills. Denies sinus pressure, nasal congestion, ear pain or sore throat. Denies chest congestion, productive cough or wheezing. Denies chest pains, palpitations and leg swelling Denies abdominal pain, nausea, vomiting,diarrhea or constipation.   Denies dysuria, frequency, hesitancy or incontinence. Denies joint pain, swelling and limitation in mobility. Denies headaches, seizures, numbness, or tingling. Marked improvement in depression and anxiety with medication and time, does not want therapy and appears to no longer need it. Denies skin break down or rash.        Objective:   Physical Exam Patient alert and oriented and in no cardiopulmonary distress.  HEENT: No facial asymmetry, EOMI, no sinus tenderness,  oropharynx pink and moist.  Neck supple no adenopathy.  Chest: Clear to auscultation bilaterally.  CVS: S1, S2 no murmurs, no S3.  ABD: Soft non tender. Bowel sounds normal.  Ext: No edema  MS: Adequate ROM spine, shoulders, hips and knees.  Skin: Intact, no ulcerations or rash noted.  Psych: Good eye contact, normal affect. Memory intact not anxious or depressed appearing.  CNS: CN 2-12 intact, power, tone and sensation normal throughout.        Assessment & Plan:

## 2011-11-27 NOTE — Telephone Encounter (Signed)
Pt is aware of OV on 5/9 at 11 with SF and reminder in epic to have tcs in 5 years

## 2011-12-15 NOTE — Assessment & Plan Note (Signed)
Improved, pt to continue med, grieving process improving with time as exopected

## 2011-12-15 NOTE — Assessment & Plan Note (Addendum)
Sleep hygiene discussed, sleep has improved as depression is improved, continue hydroxyzine

## 2011-12-15 NOTE — Assessment & Plan Note (Signed)
Hyperlipidemia:Low fat diet discussed and encouraged.  lDL slightly elevated no med change

## 2011-12-15 NOTE — Assessment & Plan Note (Signed)
No residual weakness or numbness, fully recovered

## 2011-12-18 ENCOUNTER — Encounter: Payer: Self-pay | Admitting: Gastroenterology

## 2011-12-19 ENCOUNTER — Ambulatory Visit (INDEPENDENT_AMBULATORY_CARE_PROVIDER_SITE_OTHER): Payer: Medicare Other | Admitting: Gastroenterology

## 2011-12-19 ENCOUNTER — Encounter: Payer: Self-pay | Admitting: Gastroenterology

## 2011-12-19 VITALS — BP 129/73 | HR 73 | Temp 98.4°F | Ht 65.0 in | Wt 137.2 lb

## 2011-12-19 DIAGNOSIS — R634 Abnormal weight loss: Secondary | ICD-10-CM

## 2011-12-19 MED ORDER — OMEPRAZOLE 20 MG PO CPDR: DELAYED_RELEASE_CAPSULE | ORAL | Status: AC

## 2011-12-19 NOTE — Progress Notes (Signed)
Cc to PCP 

## 2011-12-19 NOTE — Assessment & Plan Note (Addendum)
GI ROS NEGATIVE. FEELS EMOTIONALLY WELL.  OMP QAM. OPV IN 3 MOS. CONSIDER CHANGE FROM PROZAC TO REMERON IF WEIGHT LOSS CONTINUES.

## 2011-12-19 NOTE — Progress Notes (Signed)
  Subjective:    Patient ID: Lindsay Robertson, female    DOB: March 29, 1946, 66 y.o.   MRN: [id number]  Pcp: Lodema Hong  HPI The patient denies abdominal or flank pain, anorexia, nausea or vomiting, dysphagia, change in bowel habits or black or bloody stools. Lost 5 lbs since [facility] 2013. WEIGHED 148 LBS IN 2008. FEELS EMOTIONALLY WELL. GOOD APPETITE.  Past Medical History  Diagnosis Date  . Hyperlipidemia   . Hypertension   . Allergy   . 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 Depression     Past Surgical History  Procedure Date  . Cataract extraction     right eye  . Tubal ligation   . Colonoscopy 02/19/2007     5 mm sigmoid colon polyp removed/Otherwise, no masses/  internal hemorrhoids  . 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 Colonoscopy 06/06/02    internal and external non-bleeding hemorrhoids/otherwise normal  . Esophagogastroduodenoscopy 11/13/11    polyps(5)/diverticulosis,mild left side/internal hemorrhoids/schatki's ring/HH/gastritis/pyloric stenosis    No Known Allergies  Current Outpatient Prescriptions  Medication Sig Dispense Refill  . amLODipine (NORVASC) 10 MG tablet Take 1 tablet (10 mg total) by mouth daily.    Marland Kitchen atorvastatin (LIPITOR) 80 MG tablet Take 80 mg by mouth at bedtime.     . calcium-vitamin D (OSCAL WITH D) 500-200 MG-UNIT per tablet Take 1 tablet by mouth daily.      . Choline Fenofibrate (TRILIPIX) 135 MG capsule Take 135 mg by mouth at bedtime.     . cloNIDine (CATAPRES) 0.2 MG tablet Take 1 tablet (0.2 mg total) by mouth at bedtime.    . clopidogrel (PLAVIX) 75 MG tablet Take 75 mg by mouth every morning.      Marland Kitchen FLUoxetine (PROZAC) 10 MG capsule Take 1 capsule (10 mg total) by mouth daily.    . hydrOXYzine (ATARAX/VISTARIL) 10 MG tablet Take 10 mg by mouth at bedtime.    . triamterene-hydrochlorothiazide (MAXZIDE-25) 37.5-25 MG per  tablet Take 1 each (1 tablet total) by mouth daily.    Marland Kitchen omeprazole (PRILOSEC) 20 MG capsule 1 po every morning        Review of Systems     Objective:   Physical Exam  Vitals reviewed. Constitutional: She is oriented to person, place, and time. No distress.  HENT:  Head: Normocephalic and atraumatic.  Eyes: Pupils are equal, round, and reactive to light. No scleral icterus.  Neck: Normal range of motion. Neck supple.  Cardiovascular: Normal rate, regular rhythm and normal heart sounds.   Pulmonary/Chest: Effort normal and breath sounds normal. No respiratory distress.  Abdominal: Soft. Bowel sounds are normal. She exhibits no distension. There is no tenderness.  Musculoskeletal: Normal range of motion. She exhibits no edema.  Neurological: She is alert and oriented to person, place, and time.       NO FOCAL DEFICITS   Psychiatric: She has a normal mood and affect.       GOOD EYE CONTACT          Assessment & Plan:

## 2011-12-19 NOTE — Patient Instructions (Signed)
CONTINUE OMEPRAZOLE ONCE DAILY.  FOLLOW UP WITH DR. Lodema Hong.  FOLLOW UP IN 4 MOS.

## 2012-01-16 ENCOUNTER — Ambulatory Visit: Payer: Medicare Other | Admitting: Family Medicine

## 2012-01-27 ENCOUNTER — Ambulatory Visit (INDEPENDENT_AMBULATORY_CARE_PROVIDER_SITE_OTHER): Payer: Medicare Other | Admitting: Family Medicine

## 2012-01-27 ENCOUNTER — Encounter: Payer: Self-pay | Admitting: Family Medicine

## 2012-01-27 VITALS — BP 130/80 | HR 83 | Resp 16 | Ht 65.0 in | Wt 133.0 lb

## 2012-01-27 DIAGNOSIS — F329 Major depressive disorder, single episode, unspecified: Secondary | ICD-10-CM

## 2012-01-27 DIAGNOSIS — L259 Unspecified contact dermatitis, unspecified cause: Secondary | ICD-10-CM

## 2012-01-27 DIAGNOSIS — E785 Hyperlipidemia, unspecified: Secondary | ICD-10-CM

## 2012-01-27 DIAGNOSIS — L309 Dermatitis, unspecified: Secondary | ICD-10-CM

## 2012-01-27 DIAGNOSIS — I10 Essential (primary) hypertension: Secondary | ICD-10-CM

## 2012-01-27 MED ORDER — FLUOCINONIDE-E 0.05 % EX CREA: TOPICAL_CREAM | Freq: Two times a day (BID) | CUTANEOUS | Status: AC

## 2012-01-27 NOTE — Assessment & Plan Note (Signed)
Controlled, no change in medication  

## 2012-01-27 NOTE — Assessment & Plan Note (Signed)
Much improved, plan to d/c fluoxetine in Sept

## 2012-01-27 NOTE — Assessment & Plan Note (Signed)
Intermittent pruritic rash on face, responds to topical steroid, same prescribed for sparing use

## 2012-01-27 NOTE — Patient Instructions (Addendum)
F/u in 3 month  Fasting lipid, chem 7 in 3 months and hepatic panel.  No changes jn medication  i plan to stop fluoxetine at September visit

## 2012-01-27 NOTE — Assessment & Plan Note (Signed)
Hyperlipidemia:Low fat diet discussed and encouraged.  Upcoming labs in Sept prior to follow up

## 2012-01-27 NOTE — Progress Notes (Signed)
  Subjective:    Patient ID: Lindsay Robertson, female    DOB: 09/16/1945, 66 y.o.   MRN: [id number]  HPI The PT is here for follow up and re-evaluation of chronic medical conditions, medication management and review of any available recent lab and radiology data.  Preventive health is updated, specifically  Cancer screening and Immunization.   Questions or concerns regarding consultations or procedures which the PT has had in the interim are  Addressed.Has been seen recently, last month by GI has 6 month follow up.  Reports good appetite, though still has weight loss The PT denies any adverse reactions to current medications since the last visit.  C/o itchy rash on face , requests medication for this C/o swelling which is bony and over the area of the collarbone, it is painless       Review of Systems See HPI Denies recent fever or chills. Denies sinus pressure, nasal congestion, ear pain or sore throat. Denies chest congestion, productive cough or wheezing. Denies chest pains, palpitations and leg swelling Denies abdominal pain, nausea, vomiting,diarrhea or constipation.   Denies dysuria, frequency, hesitancy or incontinence. Denies joint pain, swelling and limitation in mobility. Denies headaches, seizures, numbness, or tingling. Denies depression, anxiety or insomnia.       Objective:   Physical Exam Patient alert and oriented and in no cardiopulmonary distress.  HEENT: No facial asymmetry, EOMI, no sinus tenderness,  oropharynx pink and moist.  Neck supple no adenopathy.  Chest: Clear to auscultation bilaterally.No lump or boy abnormality palpable on examination of anterior chest, no lymphadenopathy CXR in 2012 , normal  CVS: S1, S2 no murmurs, no S3.  ABD: Soft non tender. Bowel sounds normal.  Ext: No edema  MS: Adequate ROM spine, shoulders, hips and knees.  Skin: Intact, no ulcerations or rash noted.  Psych: Good eye contact, normal affect. Memory intact not  anxious or depressed appearing.  CNS: CN 2-12 intact, power, tone and sensation normal throughout.        Assessment & Plan:

## 2012-02-20 ENCOUNTER — Other Ambulatory Visit: Payer: Self-pay | Admitting: Family Medicine

## 2012-03-10 ENCOUNTER — Encounter: Payer: Self-pay | Admitting: Gastroenterology

## 2012-03-12 ENCOUNTER — Encounter: Payer: Self-pay | Admitting: Family Medicine

## 2012-03-12 ENCOUNTER — Ambulatory Visit (INDEPENDENT_AMBULATORY_CARE_PROVIDER_SITE_OTHER): Payer: Medicare Other | Admitting: Family Medicine

## 2012-03-12 ENCOUNTER — Other Ambulatory Visit: Payer: Self-pay | Admitting: Family Medicine

## 2012-03-12 VITALS — BP 130/72 | HR 67 | Resp 16 | Ht 65.0 in | Wt 134.1 lb

## 2012-03-12 DIAGNOSIS — M25569 Pain in unspecified knee: Secondary | ICD-10-CM

## 2012-03-12 DIAGNOSIS — M25561 Pain in right knee: Secondary | ICD-10-CM | POA: Insufficient documentation

## 2012-03-12 DIAGNOSIS — Z0000 Encounter for general adult medical examination without abnormal findings: Secondary | ICD-10-CM | POA: Insufficient documentation

## 2012-03-12 DIAGNOSIS — Z139 Encounter for screening, unspecified: Secondary | ICD-10-CM

## 2012-03-12 DIAGNOSIS — E785 Hyperlipidemia, unspecified: Secondary | ICD-10-CM

## 2012-03-12 DIAGNOSIS — I10 Essential (primary) hypertension: Secondary | ICD-10-CM

## 2012-03-12 MED ORDER — METHYLPREDNISOLONE ACETATE 80 MG/ML IJ SUSP
80.0000 mg | Freq: Once | INTRAMUSCULAR | Status: AC
  Administered 2012-03-12: 80 mg via INTRAMUSCULAR

## 2012-03-12 MED ORDER — KETOROLAC TROMETHAMINE 60 MG/2ML IM SOLN
60.0000 mg | Freq: Once | INTRAMUSCULAR | Status: AC
  Administered 2012-03-12: 60 mg via INTRAMUSCULAR

## 2012-03-12 NOTE — Assessment & Plan Note (Addendum)
toradol and depo medrol in office and xray Acute onset, no trauma or previous history, will refer to ortho if no improvement

## 2012-03-12 NOTE — Patient Instructions (Addendum)
F/u in 4 month You will get injections today for right knee pain, and you need an xray.Please call if the knee continues to hurt, you will be referred to orthopedics.  Your mammogram is past due , please schedule at checkout and keep appt  Please stop all alcohol

## 2012-03-12 NOTE — Assessment & Plan Note (Signed)
Medicare wellness visit done in office and documented

## 2012-03-12 NOTE — Progress Notes (Signed)
Subjective:    Patient ID: Lindsay Robertson, female    DOB: 11/20/1945, 66 y.o.   MRN: [id number]  HPI  C/o right knee pain and swelling for the past 1 month, worsening, no aggravating trauma noted. Denies significant instability, has not this problem in the past. Otherwise states she has been doing well, still intends to relocate nearer her family, in [facility] Screening-Counseling & Management   Patient present here today for a Medicare annual wellness visit.   Current Problems (verified)   Medications Prior to Visit Allergies (verified)   PAST HISTORY  Family History  Social History    Risk Factors  Current exercise habits:    Dietary issues discussed:   Cardiac risk factors:   Depression Screen  (Note: if answer to either of the following is "Yes", a more complete depression screening is indicated)   Over the past two weeks, have you felt down, depressed or hopeless? No  Over the past two weeks, have you felt little interest or pleasure in doing things? No  Have you lost interest or pleasure in daily life? No  Do you often feel hopeless? No  Do you cry easily over simple problems? No   Activities of Daily Living  In your present state of health, do you have any difficulty performing the following activities?  Driving?: No Managing money?: No Feeding yourself?:No Getting from bed to chair?:No Climbing a flight of stairs?:No Preparing food and eating?:No Bathing or showering?:No Getting dressed?:No Getting to the toilet?:No Using the toilet?:No Moving around from place to place?: No  Fall Risk Assessment In the past year have you fallen or had a near fall?:No Are you currently taking any medications that make you dizziness?:No   Hearing Difficulties: No Do you often ask people to speak up or repeat themselves?:No Do you experience ringing or noises in your ears?:No Do you have difficulty understanding soft or whispered voices?:No  Cognitive  Testing  Alert? Yes Normal Appearance?Yes  Oriented to person? Yes Place? Yes  Time? Yes  Displays appropriate judgment?Yes  Can read the correct time from a watch face? yes Are you having problems remembering things?No  Advanced Directives have been discussed with the patient?Yes    List the Names of Other Physician/Practitioners you currently use: Dr.Fields and Dr. Janene Madeira any recent Medical Services you may have received from other than [facility] providers in the past year (date may be approximate).   Assessment:    Annual Wellness Exam   Plan:    During the course of the visit the patient was educated and counseled about appropriate screening and preventive services including:  A healthy diet is rich in fruit, vegetables and whole grains. Poultry fish, nuts and beans are a healthy choice for protein rather then red meat. A low sodium diet and drinking 64 ounces of water daily is generally recommended. Oils and sweet should be limited. Carbohydrates especially for those who are diabetic or overweight, should be limited to 30-45 gram per meal. It is important to eat on a regular schedule, at least 3 times daily. Snacks should be primarily fruits, vegetables or nuts. It is important that you exercise regularly at least 30 minutes 5 times a week. If you develop chest pain, have severe difficulty breathing, or feel very tired, stop exercising immediately and seek medical attention  Immunization reviewed and updated. Cancer screening reviewed and updated    Patient Instructions (the written plan) was given to the patient.  Medicare Attestation  I have personally reviewed:  The patient's medical and social history  Their use of alcohol, tobacco or illicit drugs  Their current medications and supplements  The patient's functional ability including ADLs,fall risks, home safety risks, cognitive, and hearing and visual impairment  Diet and physical activities  Evidence for  depression or mood disorders  The patient's weight, height, BMI, and visual acuity have been recorded in the chart. I have made referrals, counseling, and provided education to the patient based on review of the above and I have provided the patient with a written personalized care plan for preventive services.      Review of Systems     Objective:   Physical Exam        Assessment & Plan:

## 2012-03-15 NOTE — Assessment & Plan Note (Signed)
Controlled, no change in medication DASH diet and commitment to daily physical activity for a minimum of 30 minutes discussed and encouraged, as a part of hypertension management. The importance of attaining a healthy weight is also discussed.  

## 2012-03-15 NOTE — Assessment & Plan Note (Signed)
Hyperlipidemia:Low fat diet discussed and encouraged.  Updated labs needed, no med change at this time

## 2012-03-16 ENCOUNTER — Ambulatory Visit (HOSPITAL_COMMUNITY)
Admission: RE | Admit: 2012-03-16 | Discharge: 2012-03-16 | Disposition: A | Discharge status: Home / Self Care | Payer: Medicare Other | Source: Ambulatory Visit | Attending: Family Medicine | Admitting: Family Medicine

## 2012-03-16 DIAGNOSIS — Z1231 Encounter for screening mammogram for malignant neoplasm of breast: Secondary | ICD-10-CM | POA: Insufficient documentation

## 2012-03-16 DIAGNOSIS — Z139 Encounter for screening, unspecified: Secondary | ICD-10-CM

## 2012-03-23 ENCOUNTER — Telehealth: Payer: Self-pay | Admitting: Family Medicine

## 2012-03-23 ENCOUNTER — Other Ambulatory Visit: Payer: Self-pay

## 2012-03-23 DIAGNOSIS — B369 Superficial mycosis, unspecified: Secondary | ICD-10-CM

## 2012-03-23 MED ORDER — CLOTRIMAZOLE-BETAMETHASONE 1-0.05 % EX CREA: TOPICAL_CREAM | Freq: Two times a day (BID) | CUTANEOUS | Status: AC

## 2012-03-23 NOTE — Telephone Encounter (Signed)
Med refilled.

## 2012-04-28 ENCOUNTER — Ambulatory Visit: Payer: Medicare Other | Admitting: Family Medicine

## 2012-06-26 ENCOUNTER — Other Ambulatory Visit: Payer: Self-pay | Admitting: Family Medicine

## 2012-07-28 ENCOUNTER — Encounter: Payer: Medicare Other | Admitting: Family Medicine

## 2013-05-30 IMAGING — CT CT ABD-PELV W/ CM
2 of 5 series · 16 of 46 positions shown, 18 images · IV contrast (Omnipaque 300)
Comparison: 04/26/2004

CLINICAL DATA: Weight loss

CT ABDOMEN AND PELVIS WITH CONTRAST
TECHNIQUE: Multidetector CT imaging of the abdomen and pelvis was
performed following the standard protocol during bolus
administration of intravenous contrast.
Contrast: 100mL OMNIPAQUE IOHEXOL 300 MG/ML IJ SOLN

[Series 2: abd_pel_with 5.0 b40f · axial · 0.65mm/px · z∈[-410,-40]mm · 13 of 84 slices shown, 15 images]
[im 5/84  soft-tissue]
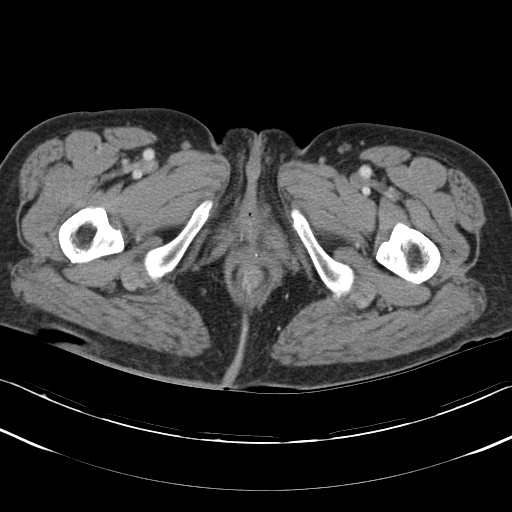
[im 5/84  bone]
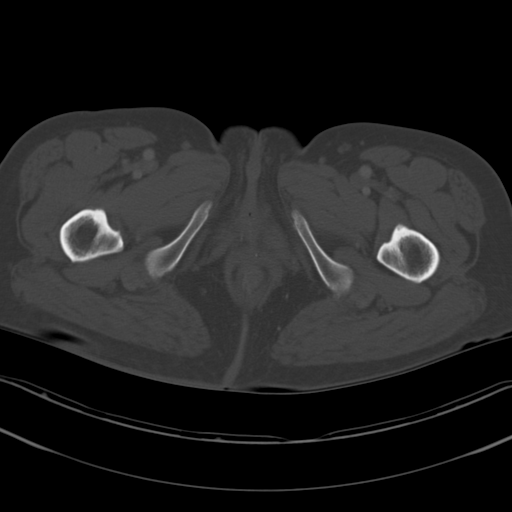
[im 10/84  soft-tissue]
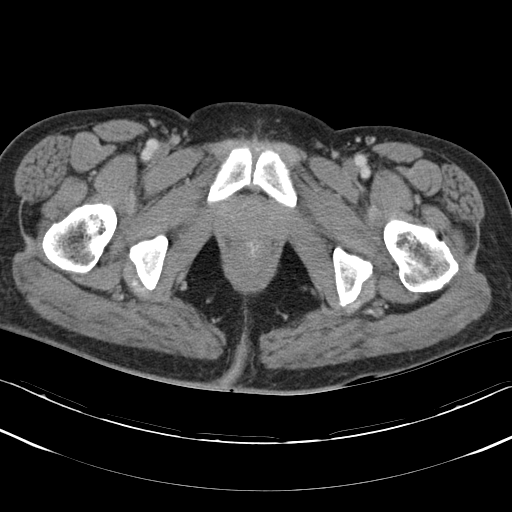
[im 19/84  soft-tissue]
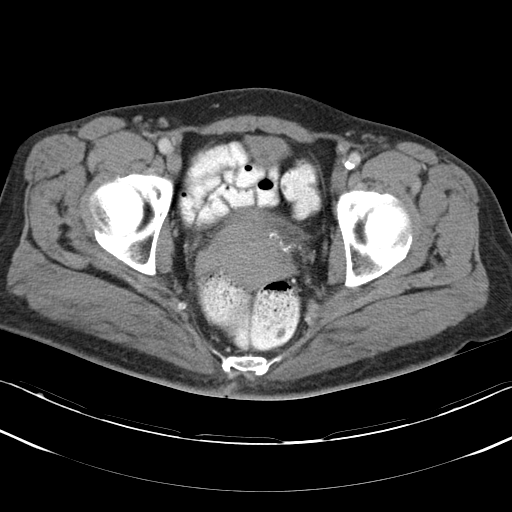
[im 24/84  soft-tissue]
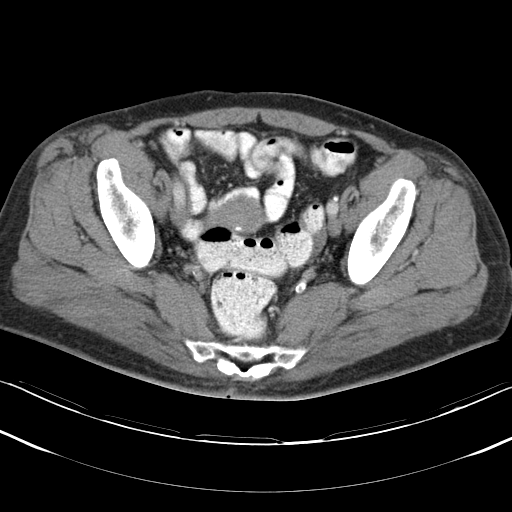
[im 28/84  soft-tissue]
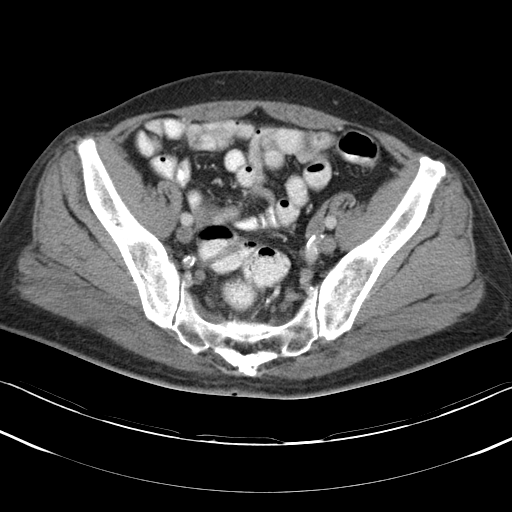
[im 37/84  soft-tissue]
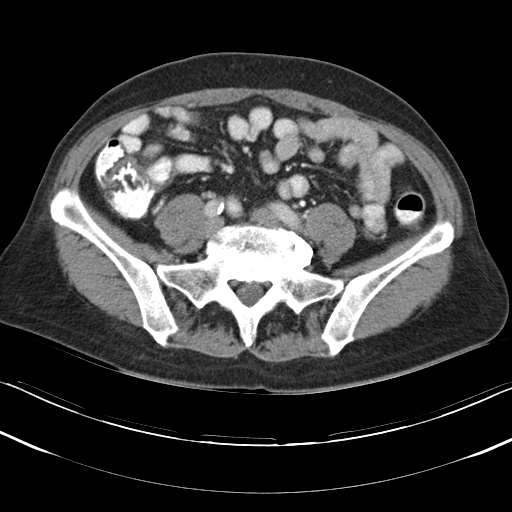
[im 42/84  soft-tissue]
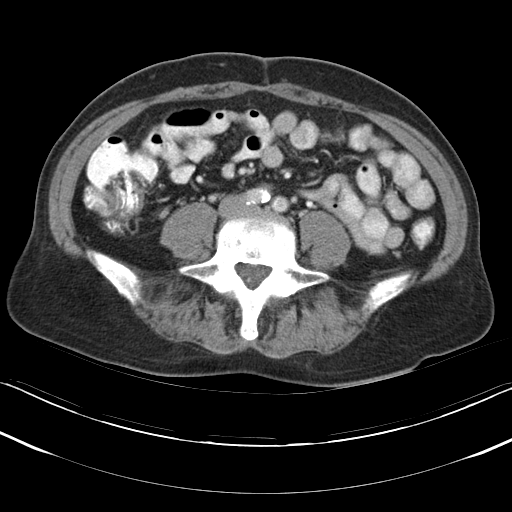
[im 47/84  soft-tissue]
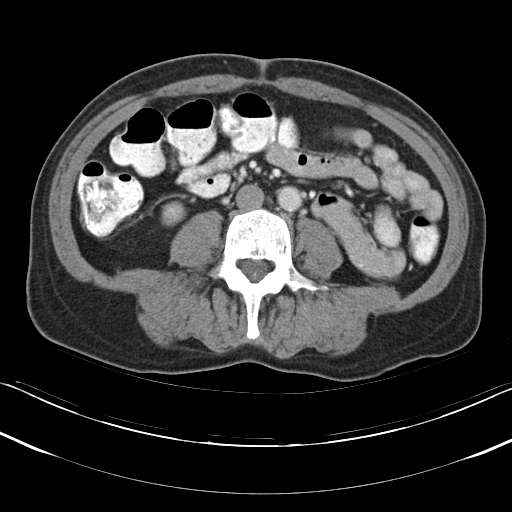
[im 56/84  soft-tissue]
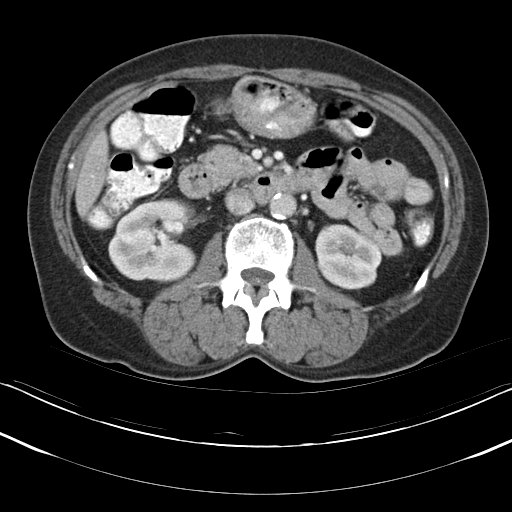
[im 56/84  bone]
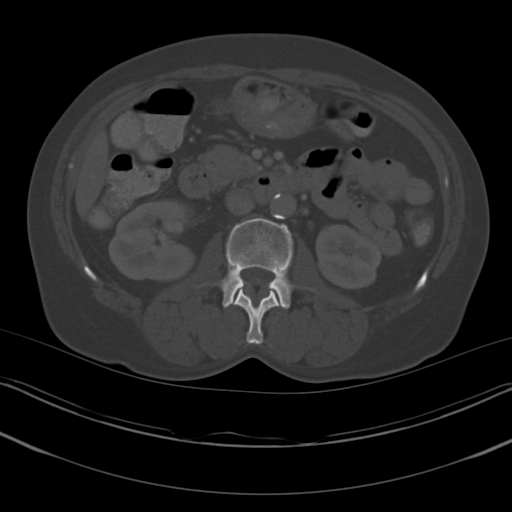
[im 60/84  soft-tissue]
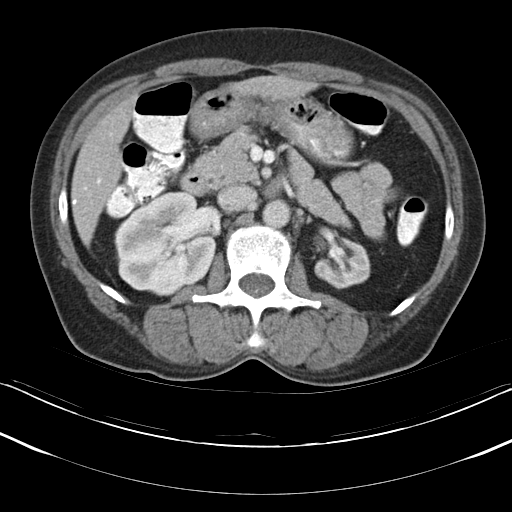
[im 65/84  soft-tissue]
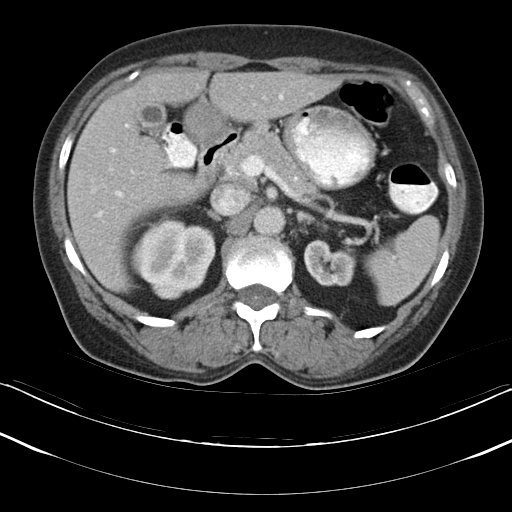
[im 74/84  soft-tissue]
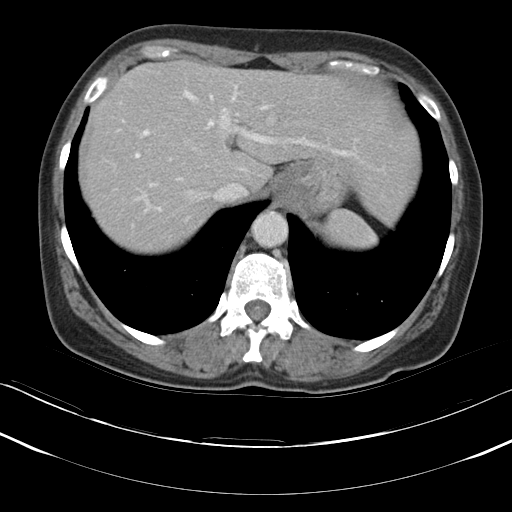
[im 79/84  soft-tissue]
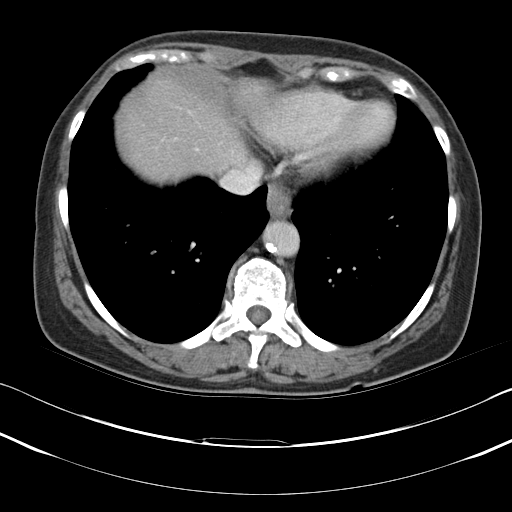

[Series 4: abd_pel_with 3.0 spo cor · coronal · 0.67mm/px · 3 of 77 slices shown]
[im 26/77  soft-tissue]
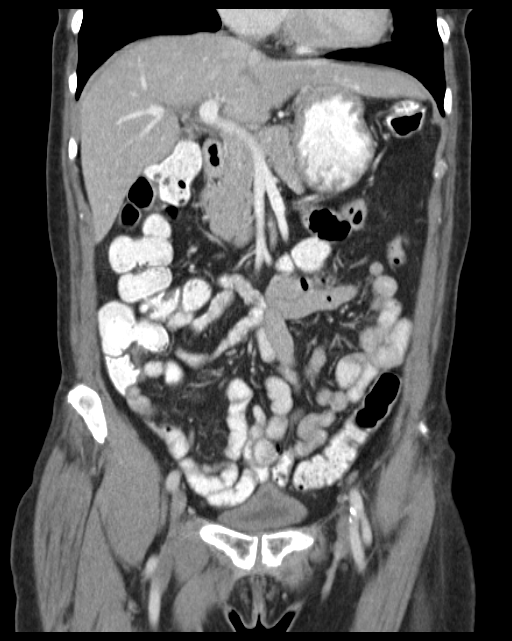
[im 34/77  soft-tissue]
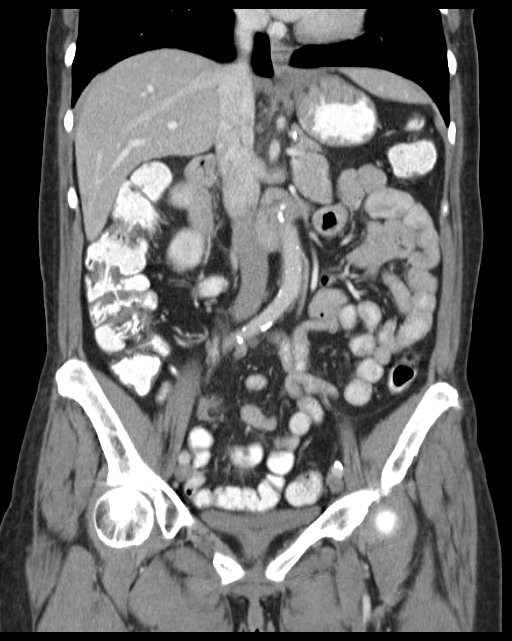
[im 43/77  soft-tissue]
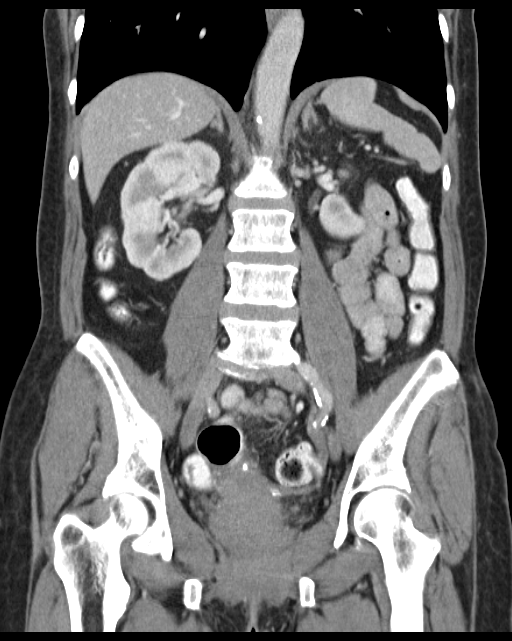

[16 of 46 positions shown; findings below may reference images not displayed]

FINDINGS: Lung bases are unremarkable.  Sagittal images of the
spine shows disc space flattening with vacuum disc phenomenon and
mild anterior and posterior spurring at L5-S1 level.

Enhanced liver is unremarkable.  No calcified gallstones are noted
within contracted gallbladder.  No intrahepatic biliary ductal
dilatation.  Again noted asymmetric atrophic left kidney.  The
right kidney measures 9.4 cm in length.  Left kidney measures
cm in length.  Tiny cyst is noted in the upper pole of the left
kidney measures 5 mm.  No hydronephrosis or hydroureter.  The
spleen, pancreas and adrenal glands are unremarkable.
Atherosclerotic calcifications of the abdominal aorta and the iliac
arteries are noted.

No aortic aneurysm. Oral contrast material was given to the
patient.  No small bowel obstruction.  No ascites or free air.  No
adenopathy.

The terminal ileum is unremarkable.  There is no pericecal
inflammation.

Normal appendix is partially visualized in the coronal image 34.

A lymph node in the right lower quadrant mesentery measures 6 mm
not pathologic by size criteria.

The uterus is atrophic.  Moderate stool noted within rectum.  No
colitis or diverticulitis.  The urinary bladder is unremarkable.
No destructive bony lesions are noted within pelvis.

Delayed renal images shows bilateral renal symmetrical excretion.
Bilateral visualized proximal ureter is unremarkable.
IMPRESSION: 1.  No acute inflammatory process within abdomen or pelvis.
2.  Again noted mild left renal atrophy.  No hydronephrosis or
hydroureter.
3.  No pericecal inflammation.  Normal appendix.

4.  Moderate stool noted within rectum.
5.  No colitis or diverticulitis.

## 2014-05-05 ENCOUNTER — Telehealth: Payer: Self-pay | Admitting: Family Medicine

## 2014-05-05 NOTE — Telephone Encounter (Signed)
Noted  

## 2016-10-14 ENCOUNTER — Encounter: Payer: Self-pay | Admitting: Gastroenterology

## 2018-12-11 DEATH — deceased
